# Patient Record
Sex: Female | Born: 1935 | Race: White | Hispanic: No | Marital: Married | Smoking: Former smoker
Health system: Southern US, Community
[De-identification: ages and names within clinical notes are randomized; demographics above are authoritative.]

## PROBLEM LIST (undated history)

## (undated) DIAGNOSIS — I219 Acute myocardial infarction, unspecified: Secondary | ICD-10-CM

## (undated) DIAGNOSIS — E871 Hypo-osmolality and hyponatremia: Secondary | ICD-10-CM

## (undated) DIAGNOSIS — D72829 Elevated white blood cell count, unspecified: Secondary | ICD-10-CM

## (undated) DIAGNOSIS — I350 Nonrheumatic aortic (valve) stenosis: Secondary | ICD-10-CM

## (undated) DIAGNOSIS — I519 Heart disease, unspecified: Secondary | ICD-10-CM

## (undated) DIAGNOSIS — I493 Ventricular premature depolarization: Secondary | ICD-10-CM

## (undated) DIAGNOSIS — Z90710 Acquired absence of both cervix and uterus: Secondary | ICD-10-CM

## (undated) DIAGNOSIS — D62 Acute posthemorrhagic anemia: Secondary | ICD-10-CM

## (undated) DIAGNOSIS — M1990 Unspecified osteoarthritis, unspecified site: Secondary | ICD-10-CM

## (undated) DIAGNOSIS — R0600 Dyspnea, unspecified: Secondary | ICD-10-CM

## (undated) DIAGNOSIS — J449 Chronic obstructive pulmonary disease, unspecified: Secondary | ICD-10-CM

## (undated) DIAGNOSIS — E876 Hypokalemia: Secondary | ICD-10-CM

## (undated) DIAGNOSIS — E785 Hyperlipidemia, unspecified: Secondary | ICD-10-CM

## (undated) DIAGNOSIS — J9620 Acute and chronic respiratory failure, unspecified whether with hypoxia or hypercapnia: Secondary | ICD-10-CM

## (undated) DIAGNOSIS — I259 Chronic ischemic heart disease, unspecified: Secondary | ICD-10-CM

## (undated) DIAGNOSIS — I10 Essential (primary) hypertension: Secondary | ICD-10-CM

## (undated) HISTORY — DX: Chronic ischemic heart disease, unspecified: I25.9

## (undated) HISTORY — DX: Ventricular premature depolarization: I49.3

## (undated) HISTORY — DX: Dyspnea, unspecified: R06.00

## (undated) HISTORY — DX: Nonrheumatic aortic (valve) stenosis: I35.0

## (undated) HISTORY — DX: Heart disease, unspecified: I51.9

## (undated) HISTORY — DX: Essential (primary) hypertension: I10

## (undated) HISTORY — DX: Elevated white blood cell count, unspecified: D72.829

## (undated) HISTORY — DX: Acute myocardial infarction, unspecified: I21.9

## (undated) HISTORY — DX: Hypokalemia: E87.6

## (undated) HISTORY — DX: Unspecified osteoarthritis, unspecified site: M19.90

## (undated) HISTORY — PX: ABDOMINAL HYSTERECTOMY: SHX81

## (undated) HISTORY — DX: Acquired absence of both cervix and uterus: Z90.710

## (undated) HISTORY — DX: Hypo-osmolality and hyponatremia: E87.1

## (undated) HISTORY — DX: Acute posthemorrhagic anemia: D62

## (undated) HISTORY — DX: Hyperlipidemia, unspecified: E78.5

## (undated) HISTORY — PX: VARICOSE VEIN SURGERY: SHX832

---

## 1980-02-23 HISTORY — PX: BUNIONECTOMY: SHX129

## 1997-05-29 ENCOUNTER — Ambulatory Visit (HOSPITAL_COMMUNITY): Admission: RE | Admit: 1997-05-29 | Discharge: 1997-05-29 | Payer: Self-pay | Admitting: Gastroenterology

## 1997-08-15 ENCOUNTER — Ambulatory Visit (HOSPITAL_COMMUNITY): Admission: RE | Admit: 1997-08-15 | Discharge: 1997-08-15 | Payer: Self-pay | Admitting: Ophthalmology

## 1999-02-23 HISTORY — PX: TOTAL KNEE ARTHROPLASTY: SHX125

## 1999-03-02 ENCOUNTER — Other Ambulatory Visit: Admission: RE | Admit: 1999-03-02 | Discharge: 1999-03-02 | Payer: Self-pay | Admitting: Internal Medicine

## 1999-06-24 ENCOUNTER — Encounter: Payer: Self-pay | Admitting: Internal Medicine

## 1999-06-24 ENCOUNTER — Encounter: Admission: RE | Admit: 1999-06-24 | Discharge: 1999-06-24 | Payer: Self-pay | Admitting: Internal Medicine

## 1999-09-09 ENCOUNTER — Encounter: Payer: Self-pay | Admitting: Orthopedic Surgery

## 1999-09-16 ENCOUNTER — Encounter: Payer: Self-pay | Admitting: Orthopedic Surgery

## 1999-09-16 ENCOUNTER — Inpatient Hospital Stay (HOSPITAL_COMMUNITY): Admission: RE | Admit: 1999-09-16 | Discharge: 1999-09-22 | Payer: Self-pay | Admitting: Orthopedic Surgery

## 1999-09-17 ENCOUNTER — Encounter: Payer: Self-pay | Admitting: Orthopedic Surgery

## 1999-09-22 ENCOUNTER — Inpatient Hospital Stay (HOSPITAL_COMMUNITY)
Admission: RE | Admit: 1999-09-22 | Discharge: 1999-09-25 | Payer: Self-pay | Admitting: Physical Medicine & Rehabilitation

## 1999-09-28 ENCOUNTER — Encounter: Payer: Self-pay | Admitting: Internal Medicine

## 1999-09-28 ENCOUNTER — Ambulatory Visit (HOSPITAL_COMMUNITY): Admission: RE | Admit: 1999-09-28 | Discharge: 1999-09-28 | Payer: Self-pay | Admitting: Internal Medicine

## 2001-04-19 ENCOUNTER — Encounter: Payer: Self-pay | Admitting: Internal Medicine

## 2001-04-19 ENCOUNTER — Encounter: Admission: RE | Admit: 2001-04-19 | Discharge: 2001-04-19 | Payer: Self-pay | Admitting: Internal Medicine

## 2002-04-24 ENCOUNTER — Ambulatory Visit (HOSPITAL_COMMUNITY): Admission: RE | Admit: 2002-04-24 | Discharge: 2002-04-24 | Payer: Self-pay | Admitting: Internal Medicine

## 2002-04-24 ENCOUNTER — Encounter: Payer: Self-pay | Admitting: Internal Medicine

## 2003-07-10 ENCOUNTER — Ambulatory Visit (HOSPITAL_COMMUNITY): Admission: RE | Admit: 2003-07-10 | Discharge: 2003-07-10 | Payer: Self-pay | Admitting: Internal Medicine

## 2004-07-15 ENCOUNTER — Ambulatory Visit (HOSPITAL_COMMUNITY): Admission: RE | Admit: 2004-07-15 | Discharge: 2004-07-15 | Payer: Self-pay | Admitting: Internal Medicine

## 2005-09-01 ENCOUNTER — Ambulatory Visit (HOSPITAL_COMMUNITY): Admission: RE | Admit: 2005-09-01 | Discharge: 2005-09-01 | Payer: Self-pay | Admitting: Internal Medicine

## 2006-03-07 DIAGNOSIS — I219 Acute myocardial infarction, unspecified: Secondary | ICD-10-CM

## 2006-03-07 HISTORY — DX: Acute myocardial infarction, unspecified: I21.9

## 2006-03-17 ENCOUNTER — Encounter (HOSPITAL_COMMUNITY): Admission: RE | Admit: 2006-03-17 | Discharge: 2006-06-15 | Payer: Self-pay | Admitting: Cardiology

## 2006-06-02 ENCOUNTER — Ambulatory Visit: Admission: RE | Admit: 2006-06-02 | Discharge: 2006-06-02 | Payer: Self-pay | Admitting: Cardiology

## 2006-06-16 ENCOUNTER — Encounter (HOSPITAL_COMMUNITY): Admission: RE | Admit: 2006-06-16 | Discharge: 2006-07-04 | Payer: Self-pay | Admitting: Cardiology

## 2006-06-17 ENCOUNTER — Ambulatory Visit: Payer: Self-pay | Admitting: Critical Care Medicine

## 2006-09-09 ENCOUNTER — Ambulatory Visit (HOSPITAL_COMMUNITY): Admission: RE | Admit: 2006-09-09 | Discharge: 2006-09-09 | Payer: Self-pay | Admitting: Internal Medicine

## 2007-09-14 ENCOUNTER — Ambulatory Visit (HOSPITAL_COMMUNITY): Admission: RE | Admit: 2007-09-14 | Discharge: 2007-09-14 | Payer: Self-pay | Admitting: Internal Medicine

## 2008-09-16 ENCOUNTER — Ambulatory Visit (HOSPITAL_COMMUNITY): Admission: RE | Admit: 2008-09-16 | Discharge: 2008-09-16 | Payer: Self-pay | Admitting: Internal Medicine

## 2009-01-02 ENCOUNTER — Encounter: Admission: RE | Admit: 2009-01-02 | Discharge: 2009-01-02 | Payer: Self-pay | Admitting: Orthopedic Surgery

## 2009-09-18 ENCOUNTER — Ambulatory Visit (HOSPITAL_COMMUNITY): Admission: RE | Admit: 2009-09-18 | Discharge: 2009-09-18 | Payer: Self-pay | Admitting: Internal Medicine

## 2010-03-13 ENCOUNTER — Ambulatory Visit: Payer: Self-pay | Admitting: Cardiology

## 2010-03-16 ENCOUNTER — Ambulatory Visit: Payer: Self-pay | Admitting: Cardiology

## 2010-03-23 ENCOUNTER — Ambulatory Visit: Admission: RE | Admit: 2010-03-23 | Discharge: 2010-03-23 | Payer: Self-pay | Source: Home / Self Care

## 2010-03-23 ENCOUNTER — Encounter: Payer: Self-pay | Admitting: Cardiology

## 2010-03-23 ENCOUNTER — Ambulatory Visit (HOSPITAL_COMMUNITY)
Admission: RE | Admit: 2010-03-23 | Discharge: 2010-03-23 | Payer: Self-pay | Source: Home / Self Care | Attending: Cardiology | Admitting: Cardiology

## 2010-06-08 ENCOUNTER — Other Ambulatory Visit: Payer: Self-pay | Admitting: Orthopedic Surgery

## 2010-06-08 ENCOUNTER — Other Ambulatory Visit (HOSPITAL_COMMUNITY): Payer: Self-pay | Admitting: Orthopedic Surgery

## 2010-06-08 ENCOUNTER — Ambulatory Visit (HOSPITAL_COMMUNITY)
Admission: RE | Admit: 2010-06-08 | Discharge: 2010-06-08 | Disposition: A | Discharge status: Home / Self Care | Payer: Medicare Other | Source: Ambulatory Visit | Attending: Orthopedic Surgery | Admitting: Orthopedic Surgery

## 2010-06-08 ENCOUNTER — Encounter (HOSPITAL_COMMUNITY): Payer: Medicare Other

## 2010-06-08 DIAGNOSIS — M1710 Unilateral primary osteoarthritis, unspecified knee: Secondary | ICD-10-CM | POA: Insufficient documentation

## 2010-06-08 DIAGNOSIS — Z79899 Other long term (current) drug therapy: Secondary | ICD-10-CM | POA: Insufficient documentation

## 2010-06-08 DIAGNOSIS — J4489 Other specified chronic obstructive pulmonary disease: Secondary | ICD-10-CM | POA: Insufficient documentation

## 2010-06-08 DIAGNOSIS — Z87891 Personal history of nicotine dependence: Secondary | ICD-10-CM | POA: Insufficient documentation

## 2010-06-08 DIAGNOSIS — I700 Atherosclerosis of aorta: Secondary | ICD-10-CM | POA: Insufficient documentation

## 2010-06-08 DIAGNOSIS — I10 Essential (primary) hypertension: Secondary | ICD-10-CM | POA: Insufficient documentation

## 2010-06-08 DIAGNOSIS — J449 Chronic obstructive pulmonary disease, unspecified: Secondary | ICD-10-CM | POA: Insufficient documentation

## 2010-06-08 DIAGNOSIS — Z01812 Encounter for preprocedural laboratory examination: Secondary | ICD-10-CM | POA: Insufficient documentation

## 2010-06-08 DIAGNOSIS — Z01818 Encounter for other preprocedural examination: Secondary | ICD-10-CM

## 2010-06-08 DIAGNOSIS — Z01811 Encounter for preprocedural respiratory examination: Secondary | ICD-10-CM | POA: Insufficient documentation

## 2010-06-08 DIAGNOSIS — Z01810 Encounter for preprocedural cardiovascular examination: Secondary | ICD-10-CM | POA: Insufficient documentation

## 2010-06-08 LAB — COMPREHENSIVE METABOLIC PANEL
Albumin: 4 g/dL (ref 3.5–5.2)
Alkaline Phosphatase: 62 U/L (ref 39–117)
Creatinine, Ser: 0.86 mg/dL (ref 0.4–1.2)
Glucose, Bld: 90 mg/dL (ref 70–99)
Potassium: 3.8 mEq/L (ref 3.5–5.1)
Sodium: 139 mEq/L (ref 135–145)
Total Bilirubin: 0.8 mg/dL (ref 0.3–1.2)
Total Protein: 7.1 g/dL (ref 6.0–8.3)

## 2010-06-08 LAB — URINALYSIS, ROUTINE W REFLEX MICROSCOPIC
Glucose, UA: NEGATIVE mg/dL
Ketones, ur: NEGATIVE mg/dL
Nitrite: NEGATIVE
Protein, ur: NEGATIVE mg/dL
Specific Gravity, Urine: 1.014 (ref 1.005–1.030)
Urobilinogen, UA: 0.2 mg/dL (ref 0.0–1.0)

## 2010-06-08 LAB — CBC
Hemoglobin: 14.8 g/dL (ref 12.0–15.0)
MCHC: 35.2 g/dL (ref 30.0–36.0)
MCV: 95.5 fL (ref 78.0–100.0)
Platelets: 311 10*3/uL (ref 150–400)
RDW: 12.9 % (ref 11.5–15.5)
WBC: 9 10*3/uL (ref 4.0–10.5)

## 2010-06-08 LAB — PROTIME-INR
INR: 0.97 (ref 0.00–1.49)
Prothrombin Time: 13.1 seconds (ref 11.6–15.2)

## 2010-06-08 LAB — DIFFERENTIAL
Basophils Absolute: 0.1 10*3/uL (ref 0.0–0.1)
Neutro Abs: 5.4 10*3/uL (ref 1.7–7.7)
Neutrophils Relative %: 60 % (ref 43–77)

## 2010-06-08 LAB — TYPE AND SCREEN
ABO/RH(D): O POS
Antibody Screen: NEGATIVE

## 2010-06-08 LAB — ABO/RH: ABO/RH(D): O POS

## 2010-06-08 LAB — SURGICAL PCR SCREEN: MRSA, PCR: NEGATIVE

## 2010-06-10 ENCOUNTER — Inpatient Hospital Stay (HOSPITAL_COMMUNITY): Payer: Medicare Other

## 2010-06-10 ENCOUNTER — Inpatient Hospital Stay (HOSPITAL_COMMUNITY)
Admission: RE | Admit: 2010-06-10 | Discharge: 2010-06-18 | DRG: 469 | Disposition: A | Discharge status: Skilled Nursing Facility | Payer: Medicare Other | Source: Ambulatory Visit | Attending: Orthopedic Surgery | Admitting: Orthopedic Surgery

## 2010-06-10 ENCOUNTER — Encounter (HOSPITAL_COMMUNITY): Payer: Self-pay | Admitting: Radiology

## 2010-06-10 DIAGNOSIS — E049 Nontoxic goiter, unspecified: Secondary | ICD-10-CM | POA: Diagnosis present

## 2010-06-10 DIAGNOSIS — Z7982 Long term (current) use of aspirin: Secondary | ICD-10-CM

## 2010-06-10 DIAGNOSIS — E871 Hypo-osmolality and hyponatremia: Secondary | ICD-10-CM | POA: Diagnosis not present

## 2010-06-10 DIAGNOSIS — I10 Essential (primary) hypertension: Secondary | ICD-10-CM | POA: Diagnosis present

## 2010-06-10 DIAGNOSIS — I509 Heart failure, unspecified: Secondary | ICD-10-CM | POA: Diagnosis present

## 2010-06-10 DIAGNOSIS — Z96659 Presence of unspecified artificial knee joint: Secondary | ICD-10-CM

## 2010-06-10 DIAGNOSIS — Z79899 Other long term (current) drug therapy: Secondary | ICD-10-CM

## 2010-06-10 DIAGNOSIS — Z01812 Encounter for preprocedural laboratory examination: Secondary | ICD-10-CM

## 2010-06-10 DIAGNOSIS — J441 Chronic obstructive pulmonary disease with (acute) exacerbation: Secondary | ICD-10-CM | POA: Diagnosis not present

## 2010-06-10 DIAGNOSIS — I5030 Unspecified diastolic (congestive) heart failure: Secondary | ICD-10-CM | POA: Diagnosis present

## 2010-06-10 DIAGNOSIS — Z780 Asymptomatic menopausal state: Secondary | ICD-10-CM

## 2010-06-10 DIAGNOSIS — J438 Other emphysema: Secondary | ICD-10-CM

## 2010-06-10 DIAGNOSIS — Z9861 Coronary angioplasty status: Secondary | ICD-10-CM

## 2010-06-10 DIAGNOSIS — M1710 Unilateral primary osteoarthritis, unspecified knee: Principal | ICD-10-CM | POA: Diagnosis present

## 2010-06-10 DIAGNOSIS — R5381 Other malaise: Secondary | ICD-10-CM | POA: Diagnosis not present

## 2010-06-10 DIAGNOSIS — I252 Old myocardial infarction: Secondary | ICD-10-CM

## 2010-06-10 DIAGNOSIS — J9620 Acute and chronic respiratory failure, unspecified whether with hypoxia or hypercapnia: Secondary | ICD-10-CM | POA: Diagnosis not present

## 2010-06-10 DIAGNOSIS — E876 Hypokalemia: Secondary | ICD-10-CM | POA: Diagnosis not present

## 2010-06-10 DIAGNOSIS — I2510 Atherosclerotic heart disease of native coronary artery without angina pectoris: Secondary | ICD-10-CM | POA: Diagnosis present

## 2010-06-10 DIAGNOSIS — J9600 Acute respiratory failure, unspecified whether with hypoxia or hypercapnia: Secondary | ICD-10-CM

## 2010-06-10 DIAGNOSIS — J9819 Other pulmonary collapse: Secondary | ICD-10-CM

## 2010-06-10 HISTORY — DX: Chronic obstructive pulmonary disease, unspecified: J44.9

## 2010-06-10 LAB — BLOOD GAS, ARTERIAL
Acid-Base Excess: 0.7 mmol/L (ref 0.0–2.0)
Bicarbonate: 24.4 mEq/L — ABNORMAL HIGH (ref 20.0–24.0)
FIO2: 0.4 %
O2 Saturation: 87.3 %
Patient temperature: 98.6
TCO2: 21.7 mmol/L (ref 0–100)
pH, Arterial: 7.425 — ABNORMAL HIGH (ref 7.350–7.400)
pO2, Arterial: 54.1 mmHg — ABNORMAL LOW (ref 80.0–100.0)

## 2010-06-10 MED ORDER — IOHEXOL 300 MG/ML  SOLN
80.0000 mL | Freq: Once | INTRAMUSCULAR | Status: AC | PRN
  Administered 2010-06-10: 80 mL via INTRAVENOUS

## 2010-06-11 LAB — PROTIME-INR
INR: 1.18 (ref 0.00–1.49)
Prothrombin Time: 15.2 seconds (ref 11.6–15.2)

## 2010-06-11 NOTE — Op Note (Signed)
NAME:  Heidi Morales, Heidi Morales NO.:  [id number]  MEDICAL RECORD NO.:  [id number]           PATIENT TYPE:  I  LOCATION:  0004                         FACILITY:  [facility]  PHYSICIAN:  Georges Lynch. Jaythen Hamme, M.D.DATE OF BIRTH:  [birth year]  DATE OF PROCEDURE:  06/10/2010 DATE OF DISCHARGE:                              OPERATIVE REPORT   SURGEON:  Georges Lynch. Darrelyn Hillock, M.D.  ASSISTANT:  Rozell Searing, P.A.C.  PREOPERATIVE DIAGNOSIS:  Severe degenerative arthritis, left knee.  POSTOPERATIVE DIAGNOSIS:  Severe degenerative arthritis, left knee.  OPERATION:  Left total knee arthroplasty utilizing the DePuy system.  I used gentamicin in the cement.  The sizes of the prosthesis was patella was 35 mm 3 pegs, femoral component was a size 3 left posterior stabilized, the tibial tray was a size 3 cemented, the insert was a size 3, 10-mm thickness.  DESCRIPTION OF PROCEDURE:  Under spinal anesthesia, routine orthopedic prep and draping of the left lower extremity was carried out.  At this time, an appropriate time-out was carried out in the operating room prior to incision, and prior to this, I marked the appropriate left leg in the holding area.  Once we did a sterile prep and draping, I then applied the _____ Mayo leg holder.  I then esmarched the leg and set the tourniquet up to 325 mmHg.  The knee was flexed, and incision was made over the anterior aspect of the left knee.  Bleeders identified and cauterized.  At this time, 2 flaps were created.  I carried out a median parapatellar approach, reflected the patella laterally, and then did a synovectomy.  I also did a medial and lateral meniscectomy and excised the anterior posterior cruciate ligaments.  Following that, initial drill hole is made in the intercondylar notch.  The guide rod then was inserted up into the femur, and I removed 12 mm thickness off the distal femur with a 5-degree valgus angle.  Following that, I then measured  the femur.  The femur initially was measured be a size 3.  We then cut our anterior-posterior chamfering cuts in usual fashion.  Once the femur was prepared, I then went down and prepared the tibia.  We removed approximately initially 6 mm thickness off the affected medial side.  We had to go another additional 2 because of the contracture of the knee. Following that, we then cut our keel cut out of the tibia, and prior to doing that, I went through the tension measurements but with the tension guides.  We had good flexion and good extension.  I then made sure there were no spurs posteriorly in the femoral condyle as well.  The keel cut was made.  I then did our femoral notch cut.  I then inserted our trial components after we thoroughly irrigated out the knee, and we had excellent fit with the 10-mm thickness insert.  Following that, I then did a resurfacing procedure on the patella for a size 35 patella.  Three drill holes were made in the patella.  At this time, all trial components were removed.  I thoroughly water picked  out the knee, cleaned the knee out cemented all 3 components in simultaneously. Following that, I made sure there were no loose pieces of cement present.  I then irrigated the knee out again, and I injected 10 mL of FloSeal into the posterior and medial and lateral aspects of the soft tissue of the knee and then inserted my permanent rotating platform size 3, 10 mm thickness tray.  I then reduced the knee after we made sure there were no other loose pieces of cement.  Indeed, we had excellent flexion, excellent extension. Hemovac drain was inserted.  The knee was closed in layers in usual fashion.  Note all 3 components were cemented.  was used in the cement. Skin was closed with metal staples.  Sterile Neosporin dressings were applied.  She had 1 g of IV Ancef preop.          ______________________________ Georges Lynch. Darrelyn Hillock, M.D.     RAG/MEDQ  D:   06/10/2010  T:  06/10/2010  Job:  [id number]  cc:   Geoffry Paradise, M.D. Fax: [phone]  Electronically Signed by Ranee Gosselin M.D. on 06/11/2010 08:06:34 AM

## 2010-06-11 NOTE — H&P (Signed)
NAME:  Heidi Morales, Heidi Morales NO.:  [id number]  MEDICAL RECORD NO.:  [id number]           PATIENT TYPE:  I  LOCATION:  1226                         FACILITY:  [facility]  PHYSICIAN:  Rozell Searing, [facility]    DATE OF BIRTH:  08/10/1930  DATE OF ADMISSION:  06/10/2010 DATE OF DISCHARGE:                             HISTORY & PHYSICAL   PHYSICIAN:  Windy Fast A. Gioffre, M.D.  CHIEF COMPLAINT:  Left knee pain.  BRIEF HISTORY:  [name] has been followed by Dr. Darrelyn Hillock for worsening pain in her left knee.  She had a right total knee about 11 years ago performed by Dr. Darrelyn Hillock and she has done well with that.  At this point, she feels the left knee is starting to feel the same as the right did prior to surgery, it is giving her pain at all times and feels as though it will give out on her.  It is affecting the activities she is able to do.  She now presents for a left total knee arthroplasty.  MEDICATION ALLERGIES:  She has sensitivity to [facility], this causes a drop in her blood pressure.  PRIMARY CARE PHYSICIAN:  Geoffry Paradise, M.D.  CARDIOLOGIST:  Colleen Can. Deborah Chalk, M.D.  CURRENT MEDICATIONS: 1. Norvasc. 2. Diovan. 3. Hydrochlorothiazide. 4. Zocor. 5. Bystolic. 6. Aspirin. 7. ICaps. 8. Citracal. 9. Vitamin D3. 10.Alprazolam p.r.n. insomnia.  PAST MEDICAL HISTORY: 1. End-stage arthritis of the left knee. 2. COPD, emphysema. 3. Hypertension. 4. Heart disease. 5. Arthritis. 6. History of menopause.  PAST SURGICAL HISTORY: 1. Vein stripping in 1965. 2. Hysterectomy in 1974. 3. Bunionectomy in 1976. 4. Right total knee arthroplasty in 2001.  FAMILY HISTORY:  Father passed at the age of 32, he had heart disease. Mother passed at the age of 44 also of heart disease.  SOCIAL HISTORY:  The patient is married.  She is retired.  She admits past use of tobacco products.  She has about 1 ounce of alcohol daily. She has 3 children.  She lives at home with her  family.  She does plan to go home following her hospital stay.  We will arrange for Genevieve Norlander to come out and do home therapy.  REVIEW OF SYSTEMS:  GENERAL:  Negative for fevers, chills, or weight change.  HEENT/NEURO:  Negative for headache, blurred vision, or insomnia.  DERMATOLOGIC:  Negative for rash or lesion.  RESPIRATORY: Positive for shortness of breath on exertion.  CARDIOVASCULAR:  Negative for chest pain or palpitations.  Last EKG was June 08, 2010.  GI: Negative for nausea, vomiting, or diarrhea.  GU:  Negative for hematuria or dysuria.  MUSCULOSKELETAL:  Positive for joint pain and joint swelling.  Ms. Grauberger has been cleared for surgery by both Dr. Ethelene Browns and Dr. Deborah Chalk.  PHYSICAL EXAMINATION:  VITAL SIGNS:  Pulse 72, respirations 18, blood pressure 138/84 in the left arm. GENERAL:  [name] is alert and oriented x3.  She is a pleasant 77- year-old female.  She has a stated height of 5 feet 4 inches and a stated weight of 150 pounds.  She is in no  apparent distress.  She is accompanied today by her husband. NECK:  Supple, full range of motion without lymphadenopathy. CHEST:  Lungs are clear to auscultation bilaterally without wheezes, rhonchi, or rales. HEART:  Regular rate and rhythm without murmur. ABDOMEN:  Bowel sounds present in all 4 quadrants. Abdomen is soft, nontender to palpation. EXTREMITIES:  Left knee:  Moderate effusion, pain with range of motion. No instability is noted.  She has clunking with range of motion. SKIN:  Unremarkable. NEUROLOGIC:  Intact. PERIPHERAL VASCULAR:  Carotid pulses 2+ bilaterally without bruit.  RADIOGRAPHS:  AP and lateral views of the left knee reveal end-stage arthritis, tricompartmental.  IMPRESSION:  End-stage arthritis of the left knee.  PLAN:  Left total knee arthroplasty to be performed by Dr. Darrelyn Hillock.     Rozell Searing, [facility]     LD/MEDQ  D:  06/11/2010  T:  06/11/2010  Job:  [id number]  Electronically Signed  by Rozell Searing  on 06/11/2010 09:32:54 AM

## 2010-06-12 ENCOUNTER — Inpatient Hospital Stay (HOSPITAL_COMMUNITY): Payer: Medicare Other

## 2010-06-12 LAB — HEMOGLOBIN AND HEMATOCRIT, BLOOD: HCT: 29.7 % — ABNORMAL LOW (ref 36.0–46.0)

## 2010-06-13 ENCOUNTER — Inpatient Hospital Stay (HOSPITAL_COMMUNITY): Payer: Medicare Other

## 2010-06-13 LAB — EXPECTORATED SPUTUM ASSESSMENT W GRAM STAIN, RFLX TO RESP C

## 2010-06-13 LAB — DIFFERENTIAL
Basophils Relative: 0 % (ref 0–1)
Eosinophils Absolute: 0.2 10*3/uL (ref 0.0–0.7)
Lymphs Abs: 1.5 10*3/uL (ref 0.7–4.0)
Neutrophils Relative %: 80 % — ABNORMAL HIGH (ref 43–77)

## 2010-06-13 LAB — BASIC METABOLIC PANEL
Chloride: 91 mEq/L — ABNORMAL LOW (ref 96–112)
GFR calc Af Amer: 58 mL/min — ABNORMAL LOW (ref >60)
Potassium: 3.1 mEq/L — ABNORMAL LOW (ref 3.5–5.1)

## 2010-06-13 LAB — CBC
Platelets: 229 10*3/uL (ref 150–400)
RBC: 3.08 MIL/uL — ABNORMAL LOW (ref 3.87–5.11)
WBC: 13.8 10*3/uL — ABNORMAL HIGH (ref 4.0–10.5)

## 2010-06-13 LAB — HEMOGLOBIN AND HEMATOCRIT, BLOOD
HCT: 27.1 % — ABNORMAL LOW (ref 36.0–46.0)
Hemoglobin: 9.5 g/dL — ABNORMAL LOW (ref 12.0–15.0)

## 2010-06-13 LAB — PROTIME-INR: INR: 1.83 — ABNORMAL HIGH (ref 0.00–1.49)

## 2010-06-14 DIAGNOSIS — I5030 Unspecified diastolic (congestive) heart failure: Secondary | ICD-10-CM

## 2010-06-14 DIAGNOSIS — J9600 Acute respiratory failure, unspecified whether with hypoxia or hypercapnia: Secondary | ICD-10-CM

## 2010-06-14 DIAGNOSIS — J9819 Other pulmonary collapse: Secondary | ICD-10-CM

## 2010-06-14 DIAGNOSIS — J449 Chronic obstructive pulmonary disease, unspecified: Secondary | ICD-10-CM

## 2010-06-14 LAB — PROTIME-INR
INR: 2.4 — ABNORMAL HIGH (ref 0.00–1.49)
Prothrombin Time: 26.3 seconds — ABNORMAL HIGH (ref 11.6–15.2)

## 2010-06-15 ENCOUNTER — Inpatient Hospital Stay (HOSPITAL_COMMUNITY): Payer: Medicare Other

## 2010-06-15 DIAGNOSIS — J449 Chronic obstructive pulmonary disease, unspecified: Secondary | ICD-10-CM

## 2010-06-15 DIAGNOSIS — I059 Rheumatic mitral valve disease, unspecified: Secondary | ICD-10-CM

## 2010-06-15 DIAGNOSIS — J9600 Acute respiratory failure, unspecified whether with hypoxia or hypercapnia: Secondary | ICD-10-CM

## 2010-06-15 DIAGNOSIS — M1710 Unilateral primary osteoarthritis, unspecified knee: Secondary | ICD-10-CM

## 2010-06-15 DIAGNOSIS — Z96659 Presence of unspecified artificial knee joint: Secondary | ICD-10-CM

## 2010-06-15 LAB — BASIC METABOLIC PANEL
CO2: 24 mEq/L (ref 19–32)
Calcium: 9 mg/dL (ref 8.4–10.5)
Creatinine, Ser: 0.98 mg/dL (ref 0.4–1.2)
GFR calc Af Amer: >60 mL/min (ref >60)

## 2010-06-15 LAB — CBC
MCH: 34.3 pg — ABNORMAL HIGH (ref 26.0–34.0)
Platelets: 307 10*3/uL (ref 150–400)
RBC: 3 MIL/uL — ABNORMAL LOW (ref 3.87–5.11)
RDW: 13.1 % (ref 11.5–15.5)

## 2010-06-15 LAB — PROTIME-INR: INR: 2.53 — ABNORMAL HIGH (ref 0.00–1.49)

## 2010-06-15 LAB — URINALYSIS, ROUTINE W REFLEX MICROSCOPIC
Hgb urine dipstick: NEGATIVE
pH: 5.5 (ref 5.0–8.0)

## 2010-06-15 LAB — CULTURE, RESPIRATORY W GRAM STAIN

## 2010-06-16 LAB — CBC
HCT: 30.3 % — ABNORMAL LOW (ref 36.0–46.0)
MCH: 33 pg (ref 26.0–34.0)
MCV: 96.2 fL (ref 78.0–100.0)
RBC: 3.15 MIL/uL — ABNORMAL LOW (ref 3.87–5.11)
WBC: 12.8 10*3/uL — ABNORMAL HIGH (ref 4.0–10.5)

## 2010-06-16 LAB — BASIC METABOLIC PANEL
Chloride: 97 mEq/L (ref 96–112)
GFR calc Af Amer: >60 mL/min (ref >60)
Potassium: 3.9 mEq/L (ref 3.5–5.1)
Sodium: 133 mEq/L — ABNORMAL LOW (ref 135–145)

## 2010-06-16 LAB — BRAIN NATRIURETIC PEPTIDE: Pro B Natriuretic peptide (BNP): 34.8 pg/mL (ref 0.0–100.0)

## 2010-06-17 DIAGNOSIS — R0902 Hypoxemia: Secondary | ICD-10-CM

## 2010-06-17 LAB — URINE CULTURE
Colony Count: 60000
Culture  Setup Time: 201204231442
Special Requests: NEGATIVE

## 2010-06-17 LAB — PROTIME-INR: INR: 2.41 — ABNORMAL HIGH (ref 0.00–1.49)

## 2010-06-18 LAB — CBC
HCT: 29.5 % — ABNORMAL LOW (ref 36.0–46.0)
Hemoglobin: 9.8 g/dL — ABNORMAL LOW (ref 12.0–15.0)
MCH: 32.1 pg (ref 26.0–34.0)
MCHC: 33.2 g/dL (ref 30.0–36.0)
MCV: 96.7 fL (ref 78.0–100.0)
Platelets: 439 10*3/uL — ABNORMAL HIGH (ref 150–400)
RBC: 3.05 MIL/uL — ABNORMAL LOW (ref 3.87–5.11)
RDW: 13.1 % (ref 11.5–15.5)
WBC: 14.4 10*3/uL — ABNORMAL HIGH (ref 4.0–10.5)

## 2010-06-19 NOTE — Discharge Summary (Signed)
NAME:  Heidi Morales, MARKERT NO.:  [id number]  MEDICAL RECORD NO.:  [id number]           PATIENT TYPE:  I  LOCATION:  1501                         [facility]ACILITY:  [facility]  PHYSICIAN:  Rozell Searing, [facility]    DATE OF BIRTH:  1930/03/27  DATE OF ADMISSION:  06/10/2010 DATE OF DISCHARGE:                              DISCHARGE SUMMARY   ADMITTING DIAGNOSES: 1. End-stage arthritis of the left knee. 2. Chronic obstructive pulmonary disease/emphysema. 3. Hypertension. 4. Heart disease. 5. Arthritis. 6. History of menopause.  DISCHARGE DIAGNOSES: 1. End-stage arthritis of the left knee, status post left total knee     arthroplasty. 2. History of chronic obstructive pulmonary disease with exacerbation     while admitted to the hospital. 3. Hypertension. 4. Heart disease. 5. Arthritis. 6. History of menopause. 7. Hyponatremia and hypokalemia, postoperative.  PROCEDURE:  On 06/10/10, [name] was taken to the operating room by surgeon, Dr. Ranee Gosselin, assistant, Rozell Searing PA-C.  She underwent left total knee arthroplasty.  The procedure was performed under spinal anesthesia.  Routine orthopedic prep and drape were carried out.  The patient received 1 g of IV Ancef preoperatively.  There were no complications with the procedure.  She was returned to the recovery room in satisfactory condition.  Postoperative radiographs revealed satisfactory left knee replacement.  LABORATORY DATA:  Preoperative labs drawn on April 16 revealed white count of 9.0, hemoglobin 14.8, hematocrit 42 and platelet count of 311. Preoperative INR was 0.97.  The patient was not on anticoagulant preoperatively.  Preoperative chemistry panel was unremarkable. Preoperative urinalysis revealed cloudy urine but was negative for infection.  Preoperative PCR for MRSA and staph were both negative. Preoperative chest x-ray revealed chronic interstitial pulmonary prominence but no active disease.  On  operative day following the surgery, the patient was having difficulty with her oxygen saturations, she was at 92% on 5 L.  Blood gases drawn revealed elevated pH and bicarb, and a chest x-ray was also done that day, which revealed COPD and bronchitis but no acute findings. On postoperative day #1, the patient's hemoglobin had dropped to 11.7, hematocrit at 34, her INR had reached 1.18.  Postoperative day #2 hemoglobin dropped further to 10.5, INR had reached 1.59.  Postop day #3 hemoglobin reached a low of 9.5, she did not require blood transfusion throughout her hospital stay, and INR had reached 1.83.  A CBC was also drawn on postoperative day #3 that revealed elevated white blood cells at 13.8, and chemistry panel that revealed hyponatremia as well as hypokalemia at 130 and 3.1 respectively.  Postop day #4, the patient's hemoglobin had reached 9.8, her INR was therapeutic at 2.4.  Postop day #5, hemoglobin was up to 10.3, CBC revealed a white count as high as 19.1, INR remained therapeutic at 2.53.  Chemistry panel revealed that the hypokalemia had resolved; she was still experiencing hyponatremia at 130.  Postop day #6, the white count was beginning to normalize at 12.8, hemoglobin was holding at 10.4, her INR had become subtherapeutic at 1.83 and postop day #7, the patient's INR was again therapeutic at 2.41.  HOSPITAL COURSE:  [name] was admitted to [facility] on 06/10/10.  She underwent the above-stated procedure without complication.  However, following the procedure, she started having trouble in the PACU with oxygenation.  There was concern that she had possibly thrown a blood clot; Dr. Darrelyn Hillock was called regarding this.  He said more likely that she could have thrown a fat emboli.  He started her on Decadron to help resolve this.  She also had a CT angio of the chest that was negative.  Then she was taken to the ICU for further care.  In regard to the knee pain,  she was placed on PCA, reduced dose of Dilaudid as well as muscle relaxant.  Pulmonary consult was called, so Dr. Sherene Sires came to see the patient.  He started her on breathing treatments and she was placed on a 50% non-rebreather mask.  The patient was started on Coumadin and heparin therapy per pharmacy postop to help protect her against blood clots.  Postop day #2, the patient was able to ambulate very, very short distances with therapy.  However, she remained very short of breath, her pulse ox was monitored at all times.  She remained on 50% Ventimask and her oxygen saturations remained in the low 90s throughout postop day #1, 2, 3 and 4.  On postoperative day #4, the patient was found to be well enough that she was transferred from the ICU to the telemetry unit.  She was continued on O2 of at least 3 L to keep her oxygen saturations above 86% per cent per Pulmonary.  Chest x- ray was repeated on postoperative day #5 and this revealed COPD with no acute process.  The patient was visited by Physical Therapy.  On postop day #5, she was able to ambulate 10 feet twice and then 15 feet twice. This was all while keeping the oxygen on and monitoring her pulse ox, which remained about 88% throughout the ambulation.  She was able to also work on transferring.  Postoperative day #6, the patient continued to progress, she had physical therapy.  She was able to work further on ambulation and transferring, unfortunately unable to walk very far distances mainly because of her deconditioning and poor oxygen saturation.  Postoperative day #7, the patient continues to progress. It was ordered for continuous pulse ox to be monitored, she does continue to improve.  She had another physical therapy session.  On postoperative day #7, she was able to ambulate 45 feet twice using a rolling walker without assistance.  She had a second physical therapy session that day in which she was able to ambulate 25 feet  twice.  She did have to wear the oxygen, on 2 L nasal cannula throughout the therapy session.  On Postoperative day #8, after discussion with Dr. Jacky Kindle, Dr. Darrelyn Hillock decided that the patient would do best if she was able to go to a skilled nursing facility for further rehab.  She chooses to go to [facility].  In her therapy session in the hospital that day she was able to ambulate 40 feet in the hallway with the rolling walker and minimal assistance.  She works again on transferring.  Disposition to skilled nursing facility on postoperative day #8.  MEDICATIONS ON DISCHARGE: 1. Acetaminophen 325 mg 1 to 2 tablets p.o. q.6 h p.r.n. pain. 2. Alprazolam 0.25 mg 1 tablet p.o. b.i.d. p.r.n. anxiety. 3. Dulcolax 5 mg tablet 2 tablets p.o. daily p.r.n. constipation. 4. Benadryl 25 mg 1 to  2 tablets p.o. q.6 h p.r.n. itching. 5. Docusate sodium 100 mg capsule 1 tablet p.o. b.i.d. p.r.n.     constipation. 6. Iron, ferrous sulfate 325 mg 1 tablet p.o. t.i.d. p.r.n. anemia. 7. Robaxin 500 mg 1 tablet p.o. q.6 h p.r.n. muscle spasm. 8. Oxycodone/acetaminophen 5/325 mg 1 to 2 tablets p.o. q.4 to 6 h     p.r.n. pain. 9. MiraLAX 17 g by mouth daily. 10.Spiriva Aerolizer 18 mcg inhaled daily. 11.Coumadin; this should be dosed per skilled nursing facility     pharmacy.  Her INR needs to be kept between 2 and 3 to remain     therapeutic.  She will remain on Coumadin therapy for 4 weeks from     the day of surgery. 12.Bystolic 10 mg 1 tablet p.o. every morning. 13.Citracal 1 tablet p.o. every morning. 14.Diovan 320 mg 1 tablet p.o. every morning. 15.Hydrochlorothiazide 25 mg, half tablet p.o. every morning. 16.Norvasc 5 mg 1 tablet p.o. every morning. 17.Simvastatin 40 mg 1 tablet p.o. at bedtime. 18.Vitamin vision, ICaps once daily. 19.Vitamin D3 1000 units 1 tablet p.o. every morning. 20.Aspirin 325 mg.  She will discontinue this while she is on the     Coumadin therapy.  ACTIVITY:  The  patient should increase her activity slowly.  She is full weightbearing.  She should be working on range of motion with physical therapy as well as gait training, really focusing on endurance, but she should have a pulse oximeter on at all times while doing therapy. The patient is able to shower and she should be using a walker for ambulation.  DIET:  No restrictions.  WOUND CARE:  Daily dressing change.  FOLLOWUP:  She should follow up with Dr. Darrelyn Hillock in the office 2 weeks from the day of surgery.  Please contact the office at [phone] to schedule this appointment.  Skilled nursing facility can schedule this appointment for the patient.  CONDITION ON DISCHARGE:  Slowly improving.  Dictated For:  Dr. Ranee Gosselin.     Rozell Searing, [facility]     LD/MEDQ  D:  06/18/2010  T:  06/18/2010  Job:  [id number]  Electronically Signed by Rozell Searing  on 06/19/2010 03:14:34 PM

## 2010-07-07 ENCOUNTER — Inpatient Hospital Stay: Payer: Medicare Other | Admitting: Pulmonary Disease

## 2010-07-10 NOTE — Discharge Summary (Signed)
[facility]  Patient:    Heidi Morales, Heidi Morales                        MRN: [id number] Adm. Date:  [id number] Disc. Date: 09/22/99 Attending:  Skip Mayer Dictator:   Alexzandrew L. Julien Girt, P.A.-C. CC:         Georges Lynch. Darrelyn Hillock, M.D.             Faith Rogue, M.D.             Richard A. Jacky Kindle, M.D.                           Discharge Summary  Patient was originally scheduled to be transferred to [facility] yesterday; however, she had complaints of lightheadedness.  Therefore, a followup hemoglobin and hematocrit were ordered prior to her discharge.  It was noted that her hemoglobin had dropped down to a level of 7.7 with a hematocrit of 22.2; this was down from the levels two days prior of 8.7 and 24.6.  She did have two units of blood and it was felt she would benefit from undergoing transfusion; therefore, her discharge to [facility] was held.  She underwent transfusion.  Post-transfusion hemoglobin was back up to 10.8 and hematocrit 30.8.  She tolerated the transfusion very well.  She was much improved.  She was still progressing with physical therapy.  She did have somewhat of a slow progress due to the nature of the surgery and also her postoperative anemia.  It was noted on the following day that she was much improved following the transfusion.  She was still progressing with physical therapy.  A bed was still available in the [facility]; therefore, it was decided the patient would be transferred at that time.  DISCHARGE PLAN:  Please see previously dictated discharge plan.  ADD TO ACTIVITY:  She will need to continue with the CPM for passive range of motion while on the rehab unit, including her total knee protocol. DD:  09/22/99 TD:  09/22/99 Job: [id number]

## 2010-07-10 NOTE — Discharge Summary (Signed)
[facility]  Patient:    Heidi Morales, Heidi Morales                        MRN: [id number] Adm. Date:  [id number] Disc. Date: 09/21/99 Attending:  Skip Mayer Dictator:   Nettie Elm, M.D. CC:         Georges Lynch. Darrelyn Hillock, M.D.                           Discharge Summary  ADMITTING DIAGNOSES: 1. Osteoarthritis of right knee. 2. Hypertension. 3. Macular degeneration.  DISCHARGE DIAGNOSES: 1. Osteoarthritis of right knee, status post right total knee arthroplasty. 2. Posthemorrhagic anemia. 3. Postoperative respiratory depression secondary to narcotics. 4. Hyponatremia. 5. Hypokalemia. 6. 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  PROCEDURE:  The patient was taken to the operating room on September 16, 1999, and underwent a right total knee replacement arthroplasty, surgeon Dr. Worthy Rancher, assistant Dr. Trellis Paganini.  CONSULTATIONS:  Rehabilitation with Dr. Riley Kill.  Medical service with Dr. Jacky Kindle.  HISTORY OF PRESENT ILLNESS:  The patient is a 77 year old female who has had pain in the right knee over the past year.  She has undergone a number of conservative treatments including antiinflammatories, Synvisc injections as well as arthroscopic debridement.  X-rays in the office revealed tricompartment degenerative changes that were also confirmed by arthroscopic evaluation.  The patients pain has continued to increase and has been refractory to conservative measures.  It was felt that she would benefit undergoing total knee replacement arthroplasty.  Risks and benefits of the procedure were discussed with the patient and she elected to proceed with surgical intervention.  LABORATORY DATA AND X-RAY FINDINGS:  Hemoglobin 12.3, hematocrit 34.8, white cell count 13.1, red cell count 3.49.  Serial H&Hs were followed throughout the surgical course.  Postsurgical hemoglobin and hematocrit were 11.5 and 32.4.  Hemoglobin and hematocrit  continued to decline down to a level of 8.6 and 24.4 on September 18, 1999.  Follow up last noted H&H in the chart had come back up a little bit to 8.7 and 27.6.  PT/PTT on admission were 13.1 and 29 respectively.  Serum PTs were followed per Coumadin protocol with last noted PT/INR 17.4 and 1.8.  Chem panel on admission all within normal limits. Followup electrolytes on July 29, showed a drop in potassium to 3.4, drop in chloride to 91 and drop in sodium to 131.  Urinalysis on admission was negative.  Blood type O positive.  Preoperative knee film showed marked degeneration of medial compartment on the right knee.  Preoperative chest x-ray report dated March 02, 1999, with no active disease.  This was compared to previous chest film of December 28, 1996. Follow up knee films showed right total knee prosthesis near anatomic alignment.  Follow up chest x-ray on September 17, 1999, showed prominent markings particularly in the right mid lung field.  These were thought to represent chronic changes with no definite pneumonia.  Preop EKG dated March 02, 1999, showed no impression of this faxed copy.  Follow up EKG on September 17, 1999, showed sinus tachycardiac, otherwise normal EKG.  This was an unconfirmed EKG.  HOSPITAL COURSE:  The patient was admitted to [facility] and taken to the OR.  She underwent the above-mentioned procedure without complication. The patient tolerated the procedure well and was transferred to the recovery room and then to the orthopedic floor.  She  was continued postoperative care. She was noted later that evening to be somewhat sedate and unable to be aroused.  It was felt to be narcotic over sedation.  She was given Narcan. The patient was arousable following the dose of Narcan.  The PCA analgesics were held until she was more alert and then they restarted back at a low dose. She was found to be, the following morning, more alert.  However, she had some audible  wheezing with some tachycardia.  Chest x-ray was ordered and ruled out pneumonia.  It was felt to be atelectasis.  She was also given some albuterol for the respiratory wheezing.  Dr. Jacky Kindle was consulted for medical management.  It was felt possibly to be atelectasis and was treated with inhalants, incentive spirometer and antipruritics.  She was noted to have some vomiting on postop day #1.  Anesthesia was consulted to evaluate her pain management.  She continued along with the PCA pain pump.  Her pain management did improve.  Her physical therapy was held the first day.  It was started on postop day #2 and was initially slow to progress with physical therapy.  Wound care consult was also initiated on postop day #2.  The wound was healing well with some mild postoperative swelling, but the amount was expected.  Nausea did improve.  She was started on Coumadin postoperatively.  Due to her slow progress, rehabilitation consult was called in.  The patient was seen and evaluated by Dr. Riley Kill from rehabilitation services and felt she would be an appropriate candidate.  It was decided the patient would be transferred at which time she was medically stable and a bed was available.  She was also noted to have a drop in sodium and potassium.  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  She was continued to be monitored throughout the weekend.  Her respiratory status improved and her lungs were clear.  The wheezing resolved. On Monday, September 21, 1999, she was doing much better.  She had been seen by Dr. Jacky Kindle who felt she was ready for rehabilitation and from an orthopedic standpoint she was improving.  She was felt to be an appropriate candidate. Therefore, arrangements were made for the patient to be transferred to [facility].  At the time of this dictation, bed availability had not been determined.  Therefore, tentative arrangements for transfer have been made.  If a bed becomes  available, the patient will subsequently be transferred to [facility].  The only complaint the patient had  was lightheadedness and H&H will be rechecked due to the patient having a drop in her hemoglobin postoperatively.  However, it appeared to have stabilized. We will check it prior to her transfer.  DISPOSITION:  The patient will be transferred to [facility] on September 21, 1999, pending bed availability.  DISCHARGE MEDICATIONS:  1. Colace 100 mg p.o. b.i.d.  2. Trinsicon t.i.d.  3. Norvasc 5 mg p.o. q.d.  4. Avapro 300 mg daily.  5. Hydrochlorothiazide 12.5 mg p.o. q.d.  6. Ogen 0.625 mg p.o. q.d.  7. Ventolin inhaler q.4h. p.r.n.  8. K-Dur 20 mEq p.o. q.d.  9. OxyContin 10 mg p.o. q.12h. 10. Coumadin as per pharmacy protocol. 11. Pepcid 20 mg p.o. b.i.d. 12. Reglan 5-10 mg p.o. q.6h. p.r.n. nausea. 13. Xanax 0.2 mg p.o. q.6h. p.r.n. anxiety. 14. Oxycodone 5 mg one every four to six hours as needed for pain.  DIET:  Low sodium diet.  ACTIVITY:  She will continue with total knee protocol.  Continue with gait training ambulation while on [facility].  She is currently weightbearing as tolerated on the right lower extremity.  WOUND CARE:  Continue with wound care and dressing changes daily.  FOLLOWUP:  The patient is to follow up with Dr. Darrelyn Hillock in the office in two weeks from the date of surgery or following the discharge from the [facility].  CONDITION ON DISCHARGE:  Discharge is pending at this time.  The patient is stable.  LABS PENDING:  There is an H&H pending at the time of this dictation. DD:  09/21/99 TD:  09/21/99 Job: [id number]

## 2010-07-10 NOTE — Discharge Summary (Signed)
[place]  Patient:    CARLIE, SOLORZANO                        MRN: [id number] Adm. Date:  [id number] Disc. Date: [id number] Attending:  Faith Rogue T Dictator:   Dian Situ, PA CC:         Georges Lynch. Darrelyn Hillock, M.D.  Richard A. Jacky Kindle, M.D.   Discharge Summary  DISCHARGE DIAGNOSES: 1. Status post right total knee replacement. 2. Hyponatremia, improved. 3. Leukocytosis almost resolved.  HISTORY OF PRESENT ILLNESS:  Ms. Ernie Avena is a 77 year old female with history of hypertension, DJD right knee, who has failed conservative therapy and elected to undergo right total knee replacement on September 16, 1999, by Dr. Darrelyn Hillock.  Postop, has had some problems with hypoxia, nausea, as well as pain control.  She has had some electrolyte abnormalities that are not improving. Has received 2 units of packed red blood cells secondary to a drop in her H&H. Has been doing well with therapies.  Is moderately independent for transfers, moderately independent for ambulating 40 to 80 feet with standard walker with knee flexion at 62 degrees.  PAST MEDICAL HISTORY: 1. Hypertension. 2. Macular degeneration. 3. Hysterectomy. 4. Varicose vein stripping. 5. History of pelvic fractures.  ALLERGIES:  No known drug allergies.  SOCIAL HISTORY:  The patient lives with husband in two level home.  Was independent prior to admission.  She has several steps at entry, and a flight of steps to bedroom.  She smokes 1/2 pack per day, and has one glass of alcoholic beverage every day.  HOSPITAL COURSE:  The patient was admitted to rehab on September 22, 1999, for inpatient therapies to consist of PT and OT daily.  Past admission the patient was started on PT and OT at ______ level.  She was maintained on Coumadin for DVT prophylaxis.  Followup labs were done at admission showing sodium with a slight dip to 130.  She was placed on mild fluid restriction, and  recheck prior to discharge showed some improvement with sodium at 132, potassium 5.1, chloride 95, CO2 28, BUN 14, creatinine 0.8, glucose 96.  H&H has been stable at 11.4 and 31.9.  White count noted to be improving from 12.8 on admission to 10.6 by time of discharge.  UAUCS done on admission was negative with some multi species growing.  The patients right knee incision has healed well without any signs or symptoms of infection, no drainage, no erythema noted. She has had some complaints of right heel numbness, and has been advised regarding symptomatic treatment.  She is to follow up with Dr. Darrelyn Hillock in one week for staple removal, as well as for staple removal.  The patient continues on Coumadin for DVT prophylaxis for 4 to 6 weeks with dose increased to 2 mg at time of discharge.  Home health BMP/INR to be drawn at Dr. Patty Sermons office on September 30, 1999.  By time of discharge the patients knee flexion remains at 66 to 68 degrees.  She is moderately independent for ADLs, modified independent for ambulating, and able to climb 12 to 14 stairs with close supervision.  Family is to provide intermittent supervision past discharge. Followup outpatient therapies has been set up at Dr. Joellyn Rued office.  On September 25, 1999, the patient is discharged to home.  DISCHARGE MEDICATIONS: 1. Coumadin 2 mg per day. 2. Pepcid 20 mg b.i.d. 3. Trinsicon one p.o. b.i.d. 4. Diovan 100  mg. 5. Hydrochlorothiazide 150/25 mg per day. 6. Norvasc 5 mg q.d. 7. ______  0.625 mg q.d. 8. Oxycodone CR one p.o. q.12h. p.r.n. pain. 9. Oxycodone IR one or two p.o. q.4-6h. p.r.n. pain.  ACTIVITY:  To use walker.  DIET:  Regular.  WOUND CARE:  Keep area clean and dry.  SPECIAL INSTRUCTIONS:  Outpatient therapies to begin in Dr. Joellyn Rued office. No alcohol, no smoking, no driving.  No aspirin or aspirin products, or NSAIDS while on Coumadin.  BMP/pro time on September 30, 1999, at Dr. Patty Sermons office.  FOLLOWUP: 1.  Dr. Darrelyn Hillock on Monday for staple removal. 2. Dr. Riley Kill as needed. DD:  11/30/99 TD:  12/01/99 Job: [id number]

## 2010-07-10 NOTE — H&P (Signed)
[facility]  Patient:    Heidi Morales, Heidi Morales                          MRN: [id number] Adm. Date:  09/16/99 Attending:  Georges Lynch. Darrelyn Hillock, M.D. Dictator:   Grayland Jack, P.A. CC:         Richard A. Jacky Kindle, M.D.                         History and Physical  DATE OF BIRTH:  06-26-1930  CHIEF COMPLAINT:  Right knee pain.  HISTORY OF PRESENT ILLNESS:  This 77 year old female has over a one-year history of severe right knee pain.  She has gone under a number of conservative treatments including oral anti inflamamtories, Synvisc injections as well as arthroscopic debridement.  X-rays reveal tricompartmental degenerative changes which were confirmed on arthroscopic evaluation.  Due to the patients continue increase in pain and disability and the pertinent findings on MRI and arthroscopic evaluation, it was felt that she would best benefit from surgical intervention at this time for a total knee replacement. The risks, benefit and complications of surgery have been discussed with the patient in detail.  ALLERGIES:  No known drug allergies.  CURRENT MEDICATIONS: 1. Ortho-Est 0.625 mg q.d. 2. Norvasc 5 mg q.d. 3. Diovan hydrochlorothiazide 160/12.5 mg q.d. 4. Motrin 800 mg q.d. 5. Multivitamin q.d.  PRIMARY CARE PHYSICIAN:  Richard A. Jacky Kindle, M.D.  PAST MEDICAL HISTORY: 1. Hypertension. 2. Macular degeneration.  PAST SURGICAL HISTORY: 1. Hysterectomy with appendectomy. 2. Bilateral vein stripping.  SOCIAL HISTORY:  The patient currently lives at home with her husband.  They reside in a two-story house with the bedrooms and bathroom being on the second floor.  The patient does have positive tobacco history of 1/2 pack per day with often one alcoholic beverage per day.  FAMILY HISTORY:  Significant for heart disease and CVA.  REVIEW OF SYSTEMS:  The patient does state that she feels like she is getting fever blister this morning.  GENERAL:   Otherwise, The patient denies fever, chills, weight changes or malaise.  HEENT: The patient denies headaches, visual changes, ear pain or pressure, nasal congestion or discharge, sore throat or dysphagia.  RESPIRATORY: The patient denies shortness of breath, productive cough or hemoptysis.  CARDIAC: The patient denies chest pain, palpitations, angina, or dyspnea on exertion.  GASTROINTESTINAL: The patient denies abdominal pain, nausea, vomiting, constipation, diarrhea or bloody stools.  GENITOURINARY: The patient denies dysuria, hematuria, increased frequency or hesitancy.  MUSCULOSKELETAL: See HPI.  HEMATOLOGIC: The patient denies history of anemia or bleeding tendencies.  PHYSICAL EXAMINATION:  VITAL SIGNS:  Temperature afebrile, pulse 96, respirations 18, blood pressure 140/85.  GENERAL:  Well-developed, well-nourished 77 year old female in no acute distress.  HEENT:  Normocephalic and atraumatic.  Pupils equal, round and reactive to light.  Extraocular muscles intact.  NECK:  Supple.  No JVD.  No lymphadenopathy.  No carotid bruits appreciated.  CHEST:  Clear to auscultation.  No rales or rhonchi.  No wheezing appreciated.  HEART:  Regular rate and rhythm.  No murmurs, rubs, or gallops appreciated.  ABDOMEN:  Positive bowel sounds.  Soft and nontender.  No organomegaly appreciated.  BREASTS, RECTAL AND GENITOURINARY:  Not done.  Not pertinent to the present illness.  EXTREMITIES:  The patient does have decreased active range of motion of the right knee.  She is tender to palpation along the medial  joint line.  SHe does have crepitus with motion.  IMPRESSION: 1. Osteoarthritis of the right knee. 2. Hypertension. 3. Macular degeneration.  PLAN:  The patient will be admitted to [facility] to undergo a right total knee arthroplasty by Dr. Ranee Gosselin.  The patient has donated two units of autologous blood, which will be available if needed.   The postoperative course including physical therapy has been discussed with the patient in detail.  All questions have been encouraged an answered. DD:  09/10/99 TD:  09/10/99 Job: [id number]

## 2010-07-10 NOTE — Op Note (Signed)
[place]. [facility]  Patient:    Heidi Morales, GUDGEL                        MRN: [id number] Proc. Date: 09/16/99 Adm. Date:  [id number] Attending:  Skip Mayer CC:         Corrie Dandy, M.D.                           Operative Report  PREOPERATIVE DIAGNOSIS:  Severe degenerative arthritis of the right knee.  POSTOPERATIVE DIAGNOSIS:  Severe degenerative arthritis of the right knee.  OPERATION:  Right total knee arthroplasty utilizing the [employer] system.  We utilized, first of all, the posterior cruciate sacrificing type prosthesis in all three complements were cemented.  The sizes used were as follows: 1.  The patella was a size 26. 2.  The femoral component was a size 5 right. 3.  The tibial tray was a size 5.  The tibial insert was a 12 mm thickness.  PROCEDURE IN DETAIL:  Under spinal anesthesia, routine orthopedic prep and drape of the right lower extremity was carried out.  At this time, the patient had one gram of IV Ancef preop.  After sterile prepping and draping were carried out, we then utilized the Esmarch before the tourniquet was elevated. The tourniquet was then elevated at 350 mm Hg.  An incision then was made down the midline in the usual fashion to the right knee.  Bleeders were identified and cauterized.  Two flaps were created and sutured in place.  Following this, we carried out a median parapatellar incision.  We reflected the patella laterally.  We then flexed the knee, carried out medial and lateral meniscectomies and excised the anterior-posterior cruciate ligaments. Her entire medial aspect of her knee was completely worn out.  She had significant arthritis within the knee.  We thoroughly irrigated out the knee. Our initial drill hole was made in the intercondylar notch.  A #1 jig was inserted, and we carried out our initial distal femoral cut at 10 mm thickness.  Once this was carried out, we then inserted a #2 jig  and this wall for a size 5 right femur.  We then carried out our anterior, posterior and chamfer cuts.  After the femur was prepared, we then prepared our tibia in the usual fashion. We removed 4 mm thickness off the affected medial side of her tibia.  After this cut was made, we then made our box cut in the femur for a posterior cruciate stabilized right knee.  Once the box cut was done, we then inserted our trial components and at this time we went through range of motion and felt that the most stable trial that we used was a size 12 mm thickness.  The trials then were removed and we made our keel cut into the tibial plateau region in the usual fashion.  We then utilized two pins into the medial femoral condyle for fixation purposes.  We then prepared our patella.  We removed 10 mm thickness from the articular surface of the patella and we utilized a size 26 patella.  We thoroughly irrigated out the knee, dried the knee out, and then cemented all three components in simultaneously.  All loose pieces of cement were removed. We thoroughly water pikd the knee numerous times.  Once this was carried out, removed our trial tibial insert tray and inspected  the knee to make sure there were no other loose pieces of cement in the posterior compartment, and there were not.  We then bone waxed the intercondylar notch in the usual fashion.  We thoroughly irrigated out the knee, dried the knee out and inserted our permanent 12 mm thickness tibial insert.  We then took the knee through a range of motion.  No lateral release was necessary.  Her patella was stable and we inserted a Hemovac drain and closed the wound in layers in the usual fashion.  We closed the skin with metal clips.  Sterile Neosporin dressing was applied, and the patient left the operating room in satisfactory condition.  FOLLOWUP CARE:  She will receive another gram of Ancef in the recovery room. She also will be started on her  Coumadin and subcu Heparin in the usual fashion. DD:  09/16/99 TD:  09/17/99 Job: [id number]

## 2010-07-10 NOTE — Assessment & Plan Note (Signed)
[facility]                             PULMONARY OFFICE NOTE   Heidi Morales, Heidi Morales                        MRN:          [id number]  DATE:06/17/2006                            DOB:          [birth year]    CHIEF COMPLAINT:  Evaluate dyspnea.   HISTORY OF PRESENT ILLNESS:  This is a 77 year old white female whose  been dyspneic now since January 2008 after she had a myocardial  infarction and stent placement. She denies any cough, she is shortness  of breath with exertion if she walks 10 minutes or longer, worse going  up hill. She is not shortness of breath at rest. There is no nocturnal  shortness of breath, there is no post nasal drainage, no edema of the  feet. She has no productive cough. She has no real chest pain. She does  have occasional chest tightness, no difficulty swallowing, no sore  throat, no nasal congestion, sneezing, itching, earache, anxiety,  depression, foot or hand swelling. She smoked for 55 year a pack a day,  quit smoking in January 2008. 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   PAST MEDICAL HISTORY:  Hypertension. She had an MI in January 2008 with  a stent placement. No other medical problems. She had a hysterectomy in  1975, appendectomy in 1975, knee replacement in 2001, hysterectomy.   MEDICATION ALLERGIES:  None.   CURRENT MEDICATIONS:  1. Norvasc 2.5 mg daily.  2. Diovan 320 mg daily.  3. HCTZ 25 mg daily.  4. Zocor 20 mg daily.  5. Toprol XL 50 mg daily.  6. Plavix 75 mg daily.  7. Aspirin 325 mg daily.  8. Vitamins daily.  9. Citrucel daily.   SOCIAL HISTORY:  As noted above, retired Film/video editor, lives at home  with her husband.   FAMILY HISTORY:  Positive for heart disease in mother and sister.   REVIEW OF SYSTEMS:  Otherwise noncontributory.   PHYSICAL EXAMINATION:  VITAL SIGNS:  Temperature 98, blood pressure  112/70, pulse 74, saturation was 90%.  CHEST:  Showed distant  breath sounds, prolonged expiratory phase, no  wheeze or rhonchi were noted.  CARDIAC:  Regular rate and rhythm without S3, normal S1, S2.  ABDOMEN:  Soft, nontender.  EXTREMITIES:  Showed no edema, clubbing or venous disease.  SKIN:  Clear.  NEUROLOGIC:  Intact.  HEENT:  No jugular venous distention or lymphadenopathy. Oropharynx  clear.  NECK:  Supple.   LABORATORY DATA:  Pulmonary functions from June 02, 2006 revealed an  FEV1 of 87%, FEF 2575 only 31% of predicted. Total lung capacity 100% of  predicted. Diffusion capacity severely reduced at 29% of predicted.  Echocardiogram had been obtained on May 31, 2006 and showed normal left  ventricular function, mild pulmonary hypertension.   Chest x-rays have not yet been obtained on this patient.   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   PLAN:  Begin Spiriva 1 capsule daily. She was instructed as to its  proper use. Note is  made that with exertion after 3 laps she dropped her  saturations to 86% on room air. At this point, we will hold off on home  oxygen therapy. We will see this patient back in return followup in 1  month. A chest x-ray today was obtained.     Charlcie Cradle Delford Field, MD, [facility]  Electronically Signed    PEW/MedQ  DD: 06/17/2006  DT: 06/17/2006  Job #: [id number]   cc:   Colleen Can. Deborah Chalk, M.D.  Geoffry Paradise, M.D.

## 2010-09-01 ENCOUNTER — Encounter: Payer: Self-pay | Admitting: Cardiovascular Disease

## 2010-09-08 ENCOUNTER — Encounter: Payer: Self-pay | Admitting: Cardiovascular Disease

## 2010-09-08 ENCOUNTER — Ambulatory Visit (INDEPENDENT_AMBULATORY_CARE_PROVIDER_SITE_OTHER): Payer: Medicare Other | Admitting: Cardiovascular Disease

## 2010-09-08 VITALS — BP 122/78 | HR 78 | Ht 64.0 in | Wt 143.8 lb

## 2010-09-08 DIAGNOSIS — J449 Chronic obstructive pulmonary disease, unspecified: Secondary | ICD-10-CM

## 2010-09-08 DIAGNOSIS — E785 Hyperlipidemia, unspecified: Secondary | ICD-10-CM

## 2010-09-08 DIAGNOSIS — I2510 Atherosclerotic heart disease of native coronary artery without angina pectoris: Secondary | ICD-10-CM | POA: Insufficient documentation

## 2010-09-08 MED ORDER — ATORVASTATIN CALCIUM 20 MG PO TABS: 20.0000 mg | ORAL_TABLET | Freq: Every day | ORAL | Status: DC

## 2010-09-08 NOTE — Assessment & Plan Note (Signed)
We'll plan on checking lipid profile soon.

## 2010-09-08 NOTE — Progress Notes (Signed)
Dollene Cleveland Date of Birth  1930-08-30 [facility] / [facility] [address]     [address] [place], [place]  [address] [phone]  Fax  [phone]  History of Present Illness:  Heidi Morales is an elderly female with a history of COPD and aortic stenosis. She also has a history of hypertension and hypercholesterolemia. She has not had any episodes of chest pain or shortness of breath since she last saw Dr. Deborah Chalk.  Current Outpatient Prescriptions on File Prior to Visit  Medication Sig Dispense Refill  . amLODipine (NORVASC) 5 MG tablet Take 5 mg by mouth daily.        Marland Kitchen aspirin 325 MG tablet Take 325 mg by mouth daily.        . Cholecalciferol (VITAMIN D PO) Take 1 tablet by mouth daily.        . hydrochlorothiazide (,MICROZIDE/HYDRODIURIL,) 12.5 MG capsule Take 12.5 mg by mouth daily.        . Multiple Vitamins-Minerals (ICAPS PO) Take 1 capsule by mouth 2 (two) times daily.       . nebivolol (BYSTOLIC) 5 MG tablet Take 10 mg by mouth daily.        Marland Kitchen tiotropium (SPIRIVA) 18 MCG inhalation capsule Place 18 mcg into inhaler and inhale as needed.       . valsartan (DIOVAN) 320 MG tablet Take 320 mg by mouth daily.          No Known Allergies  Past Medical History  Diagnosis Date  . COPD (chronic obstructive pulmonary disease)     COPD  . Hyperlipidemia   . Hypertension   . Heart attack 03/07/2006    [place]  . Aortic stenosis   . Ischemic heart disease   . Dyspnea   . H/O: hysterectomy     at age 85    Past Surgical History  Procedure Date  . Total knee arthroplasty 2001    right knee  . Bunionectomy 1982  . Varicose vein surgery     at age 42    History  Smoking status  . Former Smoker  . Quit date: 02/22/2006  Smokeless tobacco  . Not on file    History  Alcohol Use No    Family History  Problem Relation Age of Onset  . Heart disease Mother     myocardial infarction  . Hypertension Mother   . Hypertension Father     . Heart disease Sister 44    myocardial infarction  . Hypertension Sister   . Cancer Sister     bone  . Cancer Sister     bone  . Arthritis Father     Reviw of Systems:  Reviewed in the HPI.  All other systems are negative.  Physical Exam: BP 122/78  Pulse 78  Ht 5\' 4"  (1.626 m)  Wt 143 lb 12.8 oz (65.227 kg)  BMI 24.68 kg/m2  SpO2 93% The patient is alert and oriented x 3.  The mood and affect are normal.   Skin: warm and dry.  Color is normal.    HEENT:   the sclera are nonicteric.  The mucous membranes are moist.  The carotids are 2+ without bruits.  There is no thyromegaly.  There is no JVD.    Lungs: clear.  The chest wall is non tender.    Heart: regular rate with a normal S1 and S2.  There are no murmurs, gallops, or rubs. The PMI is not  displaced.     Abdomin: good bowel sounds.  There is no guarding or rebound.  There is no hepatosplenomegaly or tenderness.  There are no masses.   Extremities:  no clubbing, cyanosis, or edema.  The legs are without rashes.  The distal pulses are intact.   Neuro:  Cranial nerves II - XII are intact.  Motor and sensory functions are intact.    The gait is normal.  Assessment / Plan:

## 2010-09-08 NOTE — Assessment & Plan Note (Signed)
She has a history of PTCA and stenting several years ago while up in [place]. She's not had any episodes of angina.

## 2010-09-23 ENCOUNTER — Other Ambulatory Visit (HOSPITAL_COMMUNITY): Payer: Self-pay | Admitting: Specialist

## 2010-09-23 DIAGNOSIS — Z1231 Encounter for screening mammogram for malignant neoplasm of breast: Secondary | ICD-10-CM

## 2010-09-30 ENCOUNTER — Ambulatory Visit (HOSPITAL_COMMUNITY)
Admission: RE | Admit: 2010-09-30 | Discharge: 2010-09-30 | Disposition: A | Discharge status: Home / Self Care | Payer: Medicare Other | Source: Ambulatory Visit | Attending: Specialist | Admitting: Specialist

## 2010-09-30 DIAGNOSIS — Z1231 Encounter for screening mammogram for malignant neoplasm of breast: Secondary | ICD-10-CM | POA: Insufficient documentation

## 2011-03-08 ENCOUNTER — Ambulatory Visit (INDEPENDENT_AMBULATORY_CARE_PROVIDER_SITE_OTHER): Payer: Medicare Other | Admitting: Cardiovascular Disease

## 2011-03-08 ENCOUNTER — Encounter: Payer: Self-pay | Admitting: Cardiovascular Disease

## 2011-03-08 VITALS — BP 136/79 | HR 74 | Ht 64.0 in | Wt 147.8 lb

## 2011-03-08 DIAGNOSIS — I2510 Atherosclerotic heart disease of native coronary artery without angina pectoris: Secondary | ICD-10-CM

## 2011-03-08 NOTE — Assessment & Plan Note (Signed)
She status post an old anteroseptal myocardial infarction.   she seems to be doing quite well. She's not had any episodes of angina. We'll see her again in 6 months for followup office visit and fasting lab.

## 2011-03-08 NOTE — Progress Notes (Signed)
Dollene Cleveland Date of Birth  1930-04-26 [facility]     [place] Office  [address]    [address]   [address] [place], [place]  [address]    [place], [place]  [address] [phone]  Fax  [phone]  [phone]  Fax [phone]   History of Present Illness:  Peony is a 78 yo with a hx of COPD and CAD.  She exercises at the gym 3 times a week. She denies any angina. She has chronic shortness breath and has dyspnea on exertion with exercise. She still able to do all of her housework. She does note some fatigue.  Current Outpatient Prescriptions on File Prior to Visit  Medication Sig Dispense Refill  . amLODipine (NORVASC) 5 MG tablet Take 5 mg by mouth daily.        Marland Kitchen aspirin 325 MG tablet Take 325 mg by mouth daily.        Marland Kitchen atorvastatin (LIPITOR) 20 MG tablet Take 1 tablet (20 mg total) by mouth daily.  30 tablet  11  . calcium citrate-vitamin D (CITRACAL+D) 315-200 MG-UNIT per tablet Take 1 tablet by mouth daily.        . Cholecalciferol (VITAMIN D PO) Take 1 tablet by mouth daily.        . hydrochlorothiazide (,MICROZIDE/HYDRODIURIL,) 12.5 MG capsule Take 12.5 mg by mouth daily.        . Multiple Vitamins-Minerals (ICAPS PO) Take 1 capsule by mouth 2 (two) times daily.       . nebivolol (BYSTOLIC) 5 MG tablet Take 10 mg by mouth daily.        Marland Kitchen tiotropium (SPIRIVA) 18 MCG inhalation capsule Place 18 mcg into inhaler and inhale as needed.       . valsartan (DIOVAN) 320 MG tablet Take 320 mg by mouth daily.          Allergies  Allergen Reactions  . Phenergan Other (See Comments)    Drop in Blood pressure    Past Medical History  Diagnosis Date  . COPD (chronic obstructive pulmonary disease)     COPD  . Hyperlipidemia   . Hypertension   . Heart attack 03/07/2006    [place]  . Aortic stenosis   . Ischemic heart disease   . Dyspnea   . H/O: hysterectomy     at age 72  . Arthritis     End-stage arthritis of the left knee  . Emphysema    . Heart disease   . Hyponatremia     postoperative  . Hypokalemia     postoperative  . Leukocytosis   . Acute posthemorrhagic anemia     Past Surgical History  Procedure Date  . Total knee arthroplasty 2001    right knee  . Bunionectomy 1982  . Varicose vein surgery     at age 58--Bilateral vein stripping    History  Smoking status  . Former Smoker  . Quit date: 02/22/2006  Smokeless tobacco  . Not on file    History  Alcohol Use No    Family History  Problem Relation Age of Onset  . Heart disease Mother     myocardial infarction  . Hypertension Mother   . Hypertension Father   . Heart disease Sister 70    myocardial infarction  . Hypertension Sister   . Cancer Sister     bone  . Cancer Sister     bone  . Arthritis Father  Reviw of Systems:  Reviewed in the HPI.  All other systems are negative.  Physical Exam: BP 136/79  Pulse 74  Ht 5\' 4"  (1.626 m)  Wt 147 lb 12.8 oz (67.042 kg)  BMI 25.37 kg/m2 The patient is alert and oriented x 3.  The mood and affect are normal.   Skin: warm and dry.  Color is normal.    HEENT:   Normocephalic/atraumatic. Mucous membranes are moist. There is no JVD. Carotids are normal.  Lungs: few wheezes   Heart: Regular rate S1-S2.    Abdomen: Good bowel sounds. Her abdomen is nontender to  Extremities:  No clubbing cyanosis or edema.  Neuro:  Her exam is nonfocal. Her gait is normal.    ECG: NSR, septal MI - old.  No acute ST /T wave changes  Assessment / Plan:

## 2011-03-08 NOTE — Patient Instructions (Signed)
Your physician wants you to follow-up in: 6 months  You will receive a reminder letter in the mail two months in advance. If you don't receive a letter, please call our office to schedule the follow-up appointment.  Your physician recommends that you return for a FASTING lipid profile: 6 months   

## 2011-04-09 DIAGNOSIS — H3530 Unspecified macular degeneration: Secondary | ICD-10-CM | POA: Insufficient documentation

## 2011-06-29 ENCOUNTER — Other Ambulatory Visit: Payer: Self-pay | Admitting: Cardiovascular Disease

## 2011-06-29 DIAGNOSIS — I2510 Atherosclerotic heart disease of native coronary artery without angina pectoris: Secondary | ICD-10-CM

## 2011-06-29 MED ORDER — ATORVASTATIN CALCIUM 20 MG PO TABS: 20.0000 mg | ORAL_TABLET | Freq: Every day | ORAL | Status: DC

## 2011-06-29 NOTE — Telephone Encounter (Signed)
Refill- Atorvastatin (LIPITOR) 20 MG tablet    Changed preferred to [employer] on [address] per husband Shalene Gallen.

## 2011-10-29 DIAGNOSIS — H5210 Myopia, unspecified eye: Secondary | ICD-10-CM | POA: Insufficient documentation

## 2011-10-29 DIAGNOSIS — H3533 Angioid streaks of macula: Secondary | ICD-10-CM | POA: Insufficient documentation

## 2011-12-16 ENCOUNTER — Other Ambulatory Visit (HOSPITAL_COMMUNITY): Payer: Self-pay | Admitting: Internal Medicine

## 2011-12-16 DIAGNOSIS — Z1231 Encounter for screening mammogram for malignant neoplasm of breast: Secondary | ICD-10-CM

## 2012-01-06 ENCOUNTER — Ambulatory Visit (HOSPITAL_COMMUNITY)
Admission: RE | Admit: 2012-01-06 | Discharge: 2012-01-06 | Disposition: A | Discharge status: Home / Self Care | Payer: Medicare Other | Source: Ambulatory Visit | Attending: Internal Medicine | Admitting: Internal Medicine

## 2012-01-06 DIAGNOSIS — Z1231 Encounter for screening mammogram for malignant neoplasm of breast: Secondary | ICD-10-CM | POA: Insufficient documentation

## 2012-06-05 ENCOUNTER — Encounter: Payer: Self-pay | Admitting: Cardiology

## 2012-06-08 DIAGNOSIS — Z9889 Other specified postprocedural states: Secondary | ICD-10-CM | POA: Insufficient documentation

## 2012-06-08 DIAGNOSIS — H353190 Nonexudative age-related macular degeneration, unspecified eye, stage unspecified: Secondary | ICD-10-CM | POA: Insufficient documentation

## 2012-06-16 IMAGING — CR DG CHEST 2V
2 series · 2 of 2 positions shown · non-contrast
Comparison: 01/02/2009

CLINICAL DATA: Preoperative respiratory exam for knee surgery.
History of cardiac disease.  Hypertension.  Smoking history.

CHEST - 2 VIEW

[w chest pa]
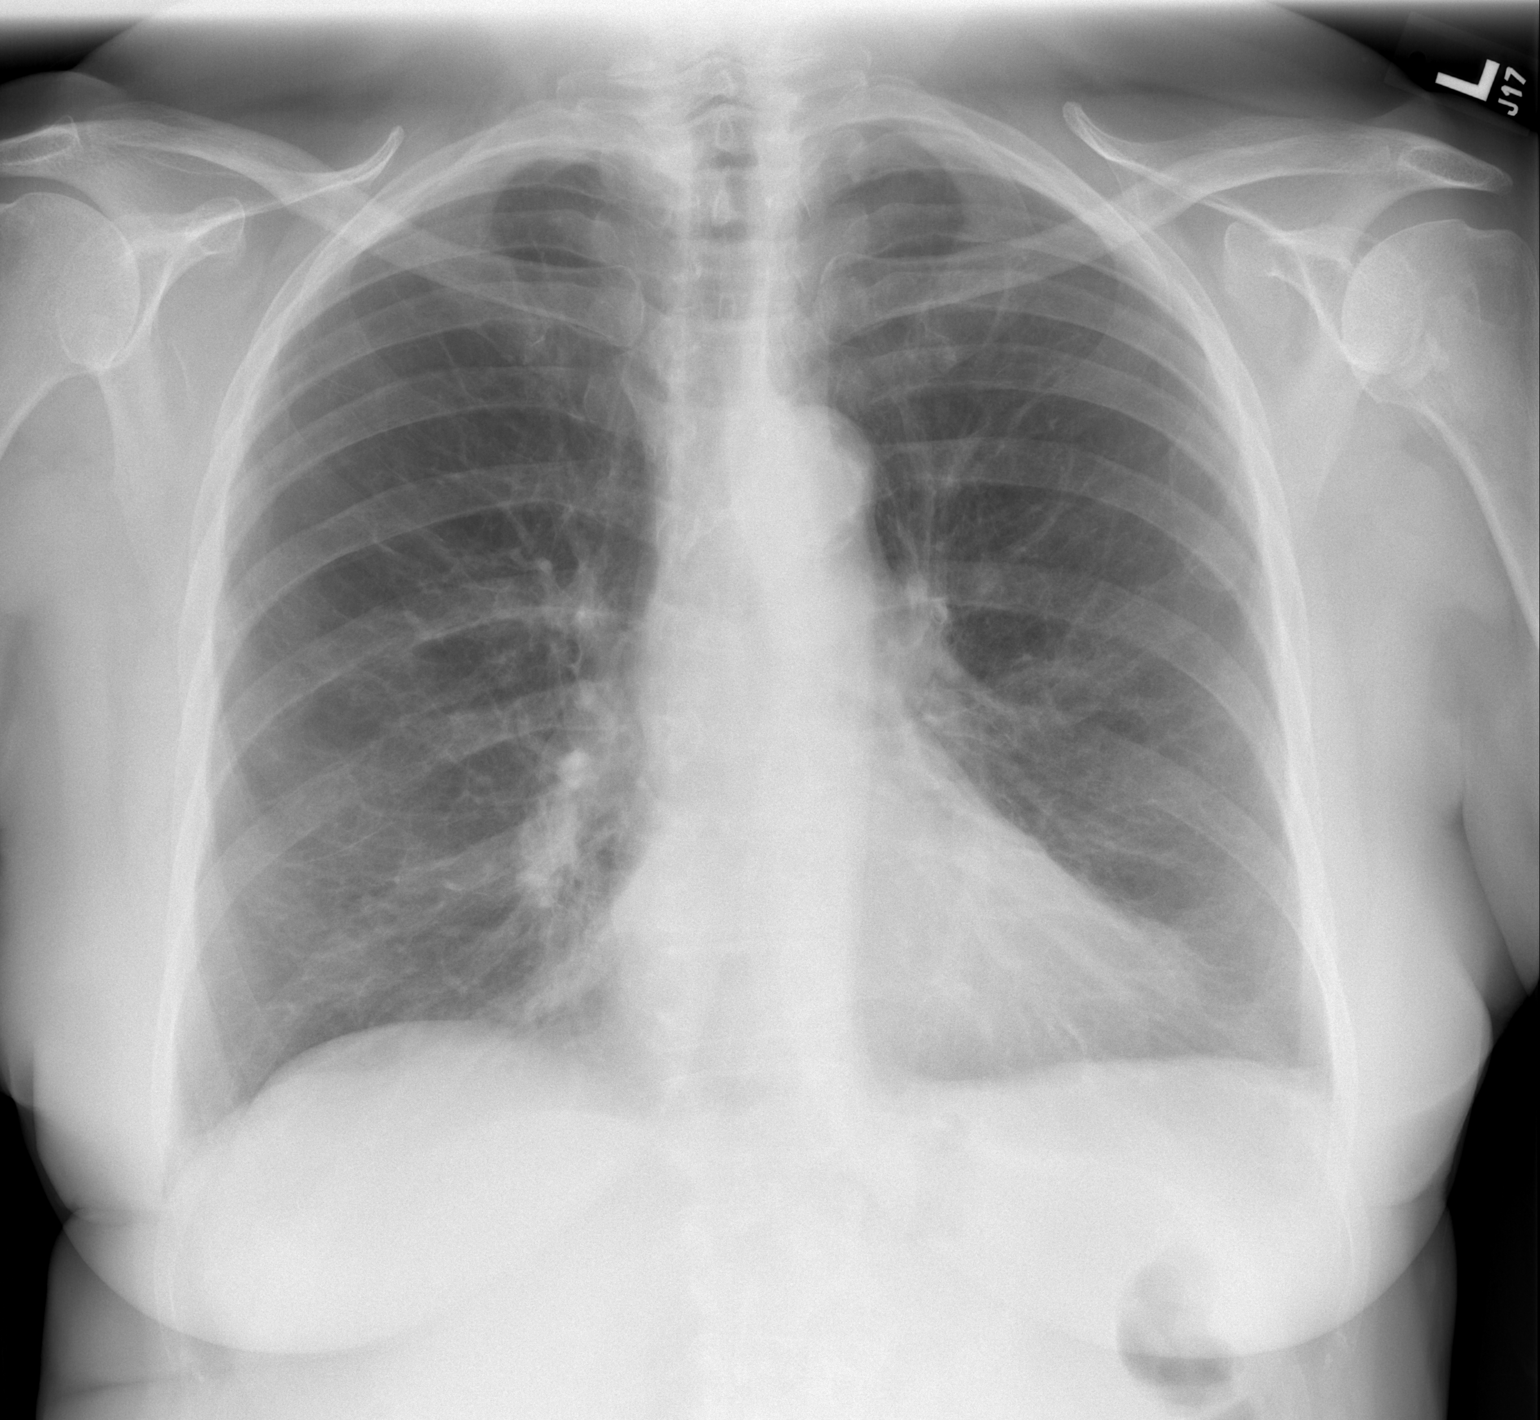

[w chest lat]
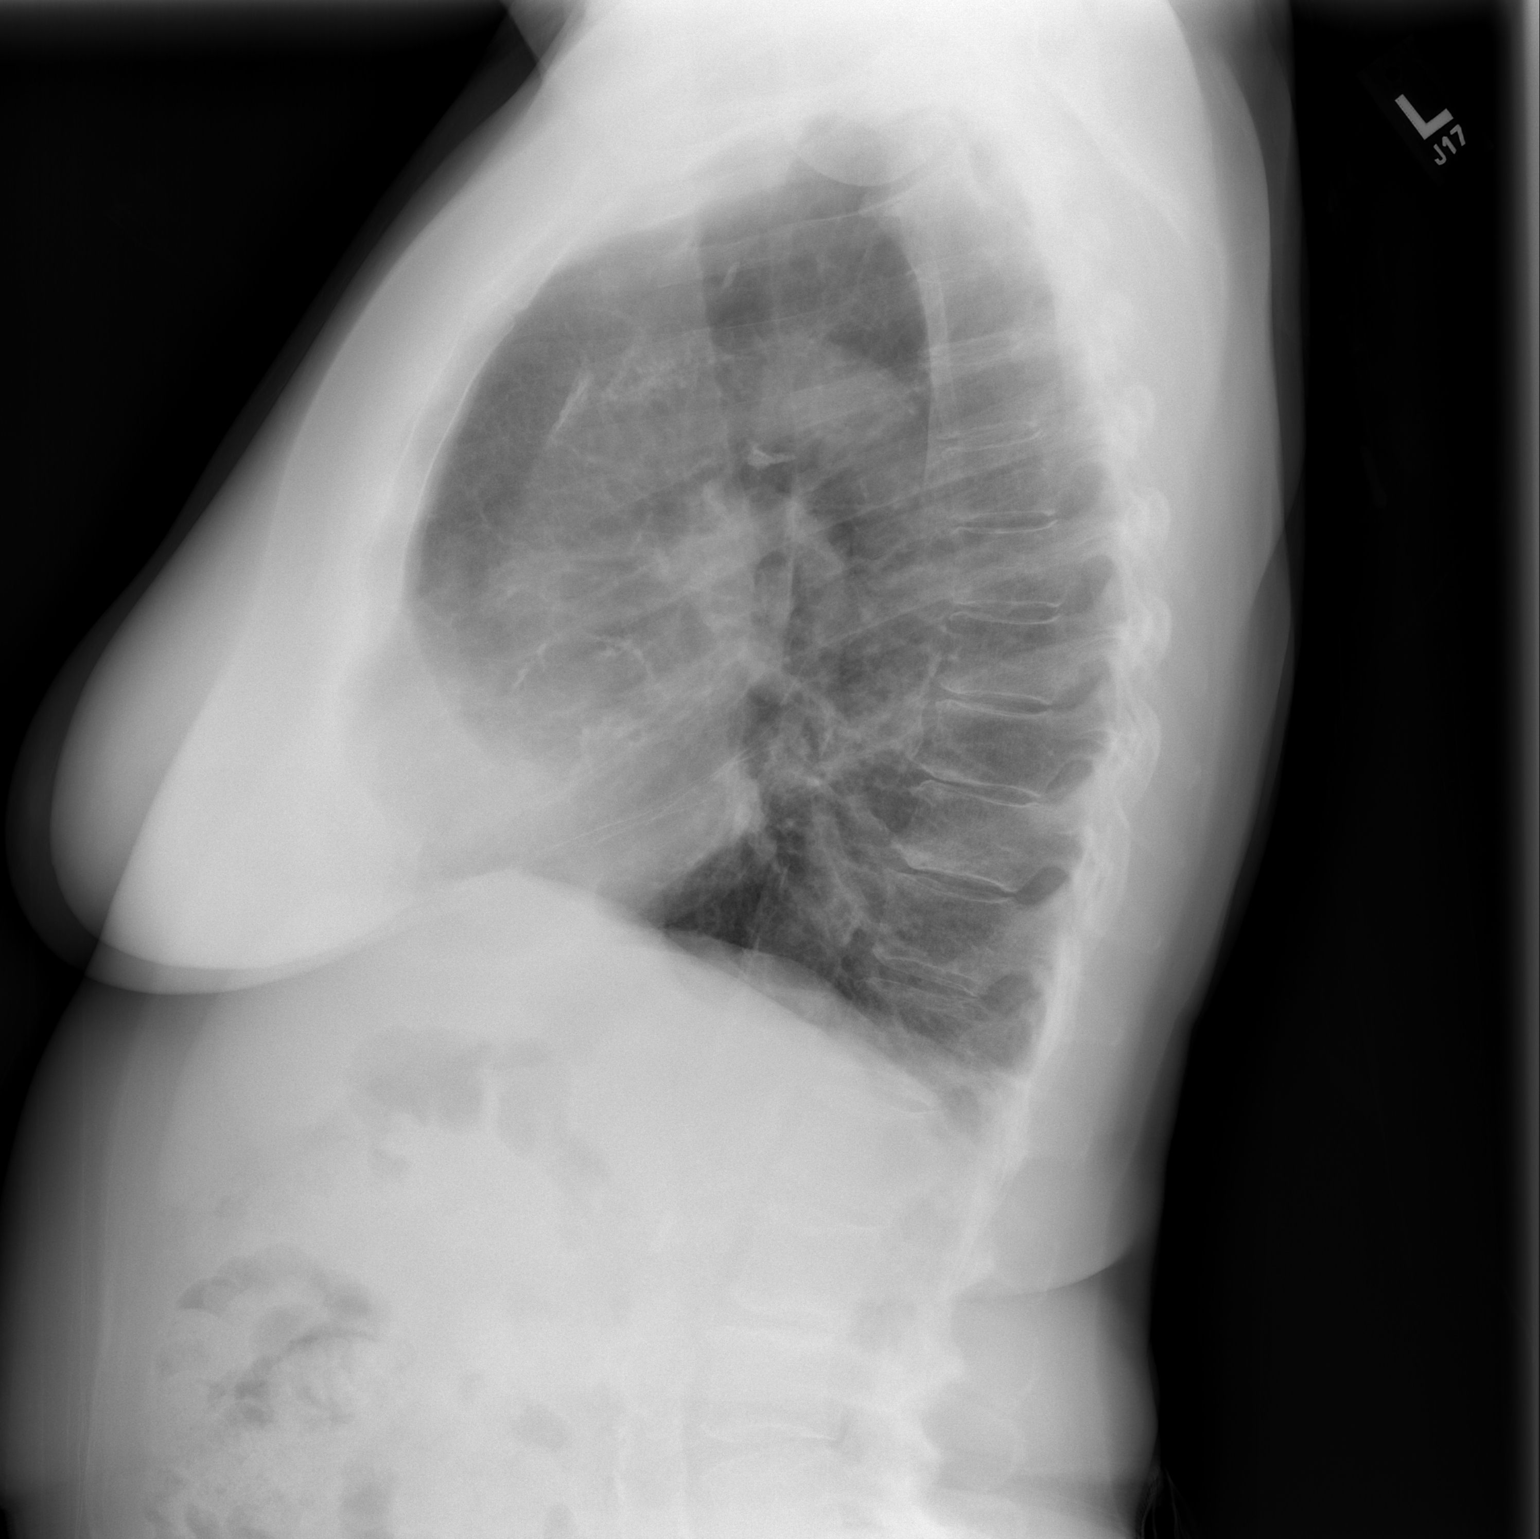

[2 of 2 positions shown; findings below may reference images not displayed]

FINDINGS: Heart size is normal.  Mediastinal shadows are normal.
There is chronic interstitial pulmonary prominence but no evidence
of active infiltrate, mass, effusion or collapse.  There is
atherosclerosis of the aorta.
IMPRESSION: Chronic atherosclerosis.  Chronic interstitial pulmonary
prominence.  No active disease identified.

## 2012-06-18 IMAGING — CR DG KNEE 1-2V PORT*L*
1 series · 2 of 2 positions shown · non-contrast
Comparison: None

CLINICAL DATA: Total knee replacement

PORTABLE LEFT KNEE - 1-2 VIEW

[Series 1: AP · left · 2 of 2 slices shown]
[im 1/2]
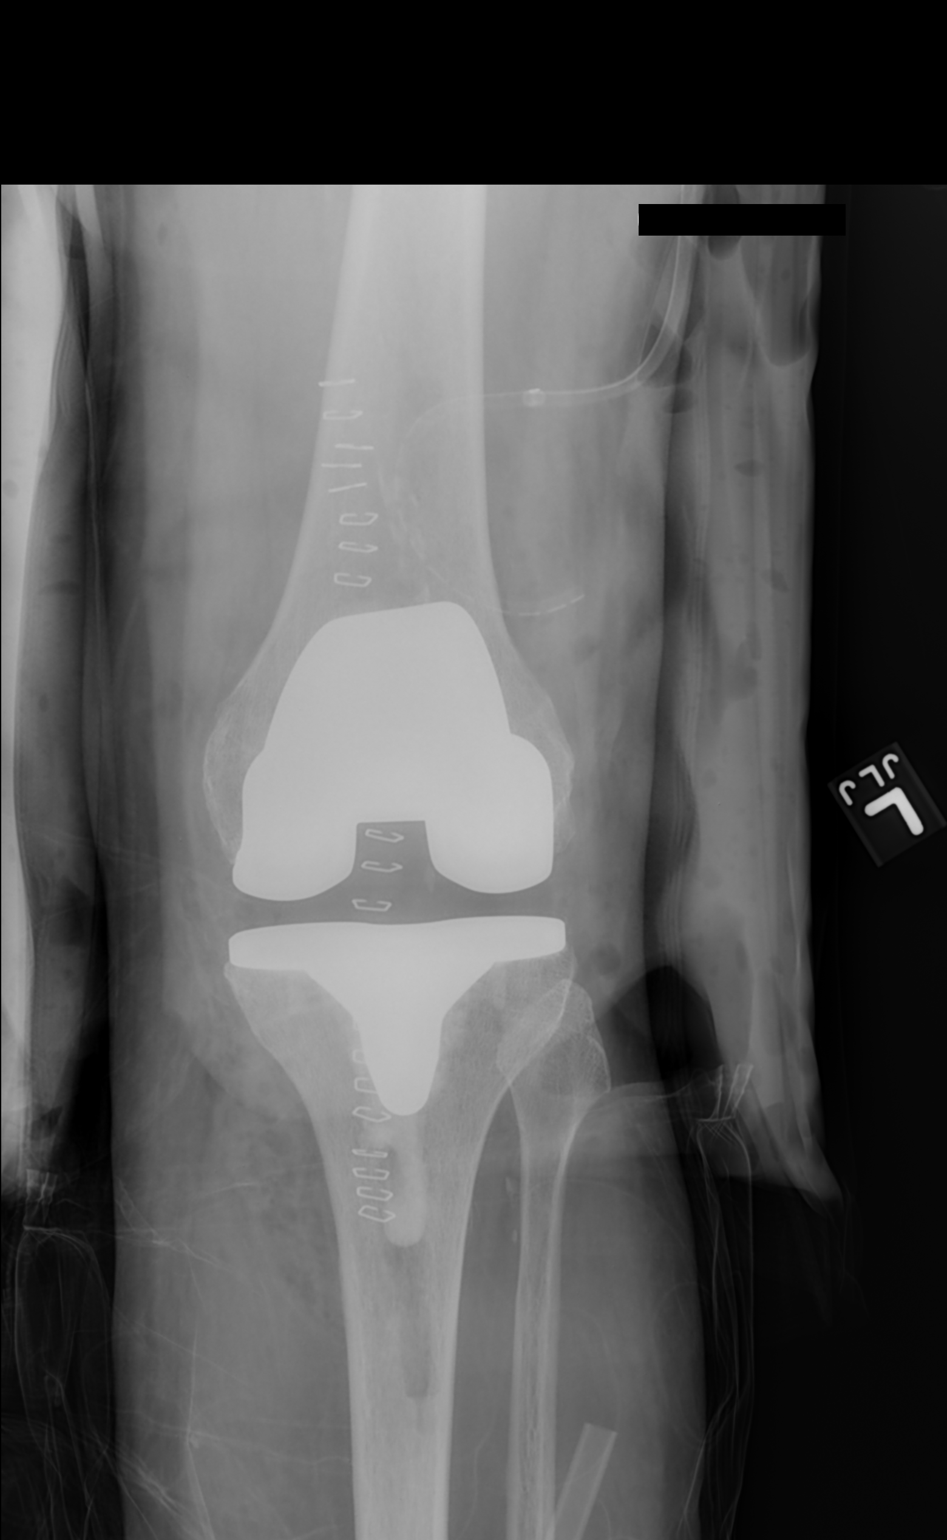
[im 2/2]
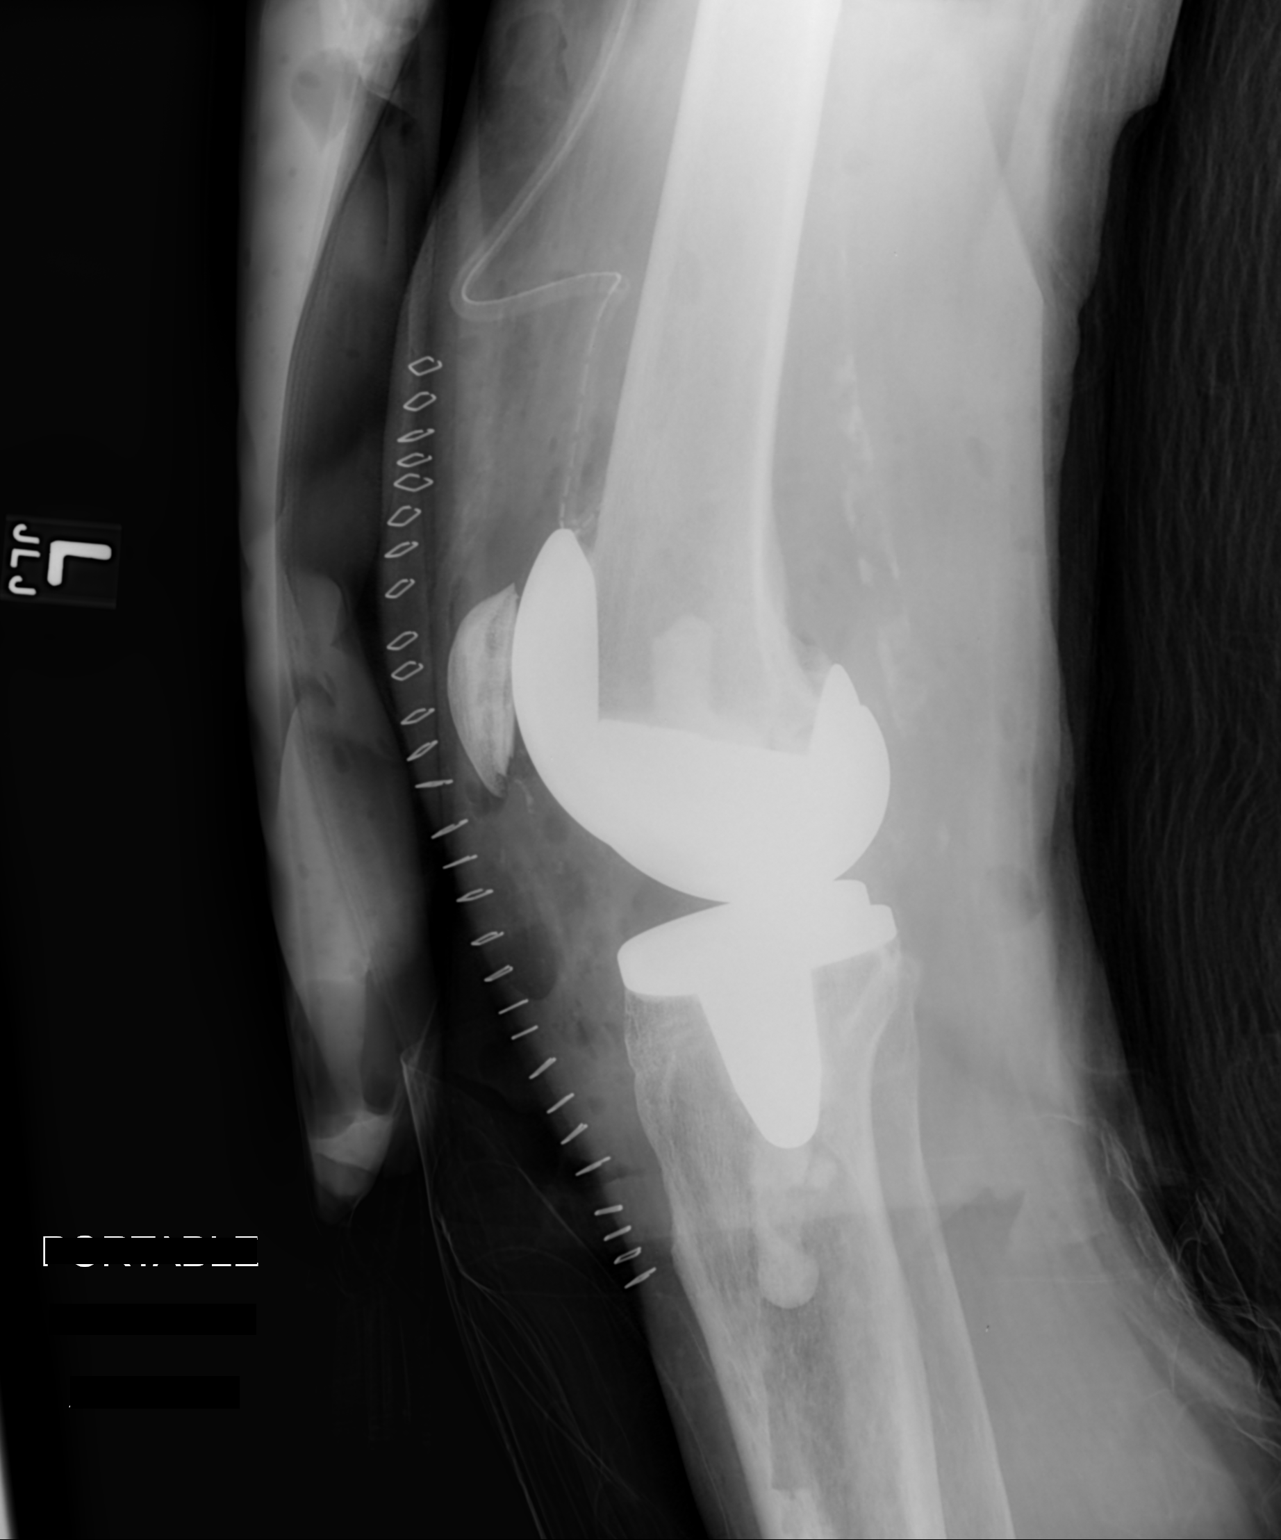

[2 of 2 positions shown; findings below may reference images not displayed]

FINDINGS: Total knee replacement is in satisfactory position and
alignment.  Drain is present in the suprapatellar bursa.  Gas is
present in the soft tissues.

Negative for fracture or acute abnormality.
IMPRESSION: Satisfactory right knee replacement.

## 2012-06-18 IMAGING — CT CT ANGIO CHEST
2 of 6 series · 19 of 36 positions shown · IV contrast (APPLIED)
Comparison: 06/10/2010.

CLINICAL DATA: Short of breath, rule out pulmonary embolism.  Knee
replacement.

CT ANGIOGRAPHY CHEST WITH CONTRAST
TECHNIQUE: Multidetector CT imaging of the chest was performed
using the standard protocol during bolus administration of
intravenous contrast.  Multiplanar CT image reconstructions
including MIPs were obtained to evaluate the vascular anatomy.
Contrast:  80 ml [id number]-VZZ IV

[Series 6: pe thins @ 1mm · axial · 0.76mm/px · z∈[-179,+37]mm · 18 of 241 slices shown]
[im 13/241  lung]
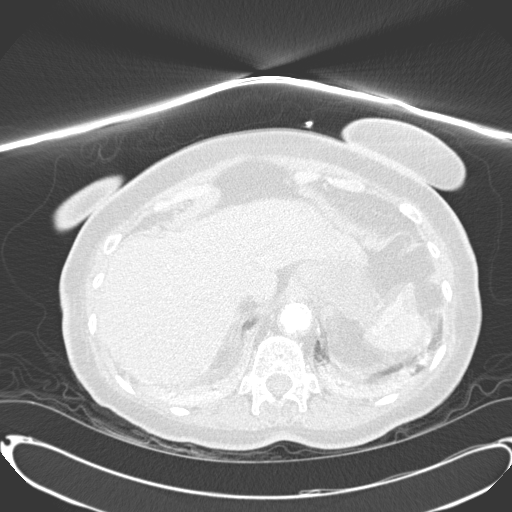
[im 25/241  mediastinal]
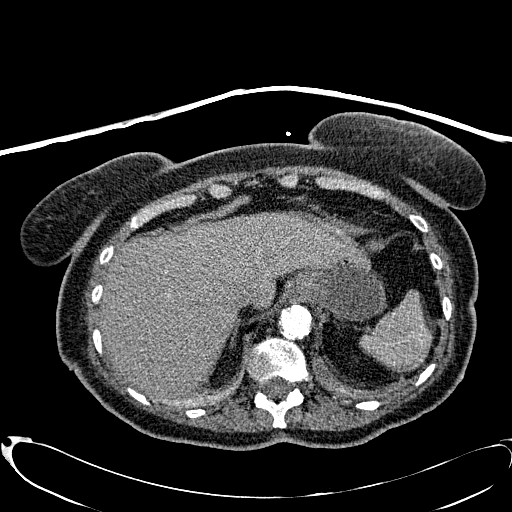
[im 37/241  lung]
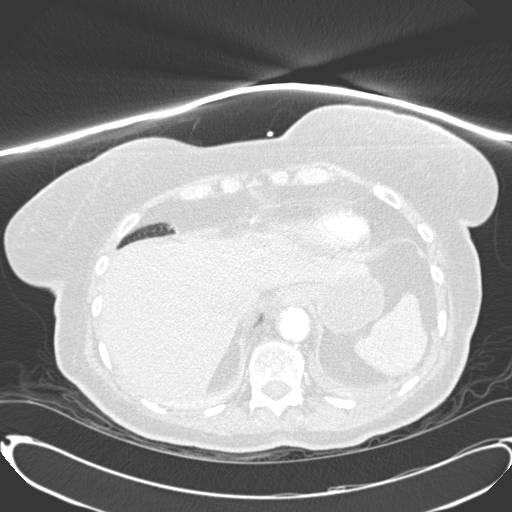
[im 49/241  mediastinal]
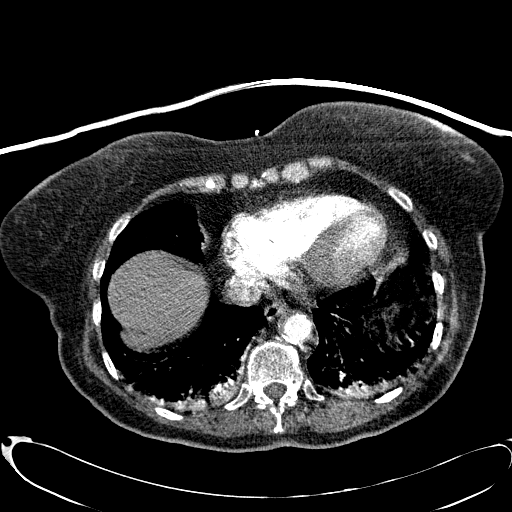
[im 61/241  lung]
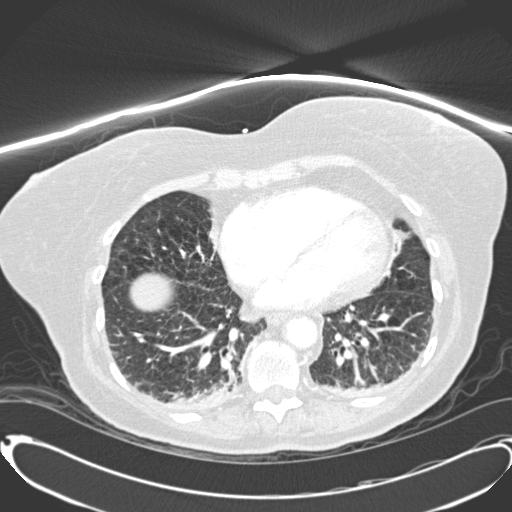
[im 73/241  mediastinal]
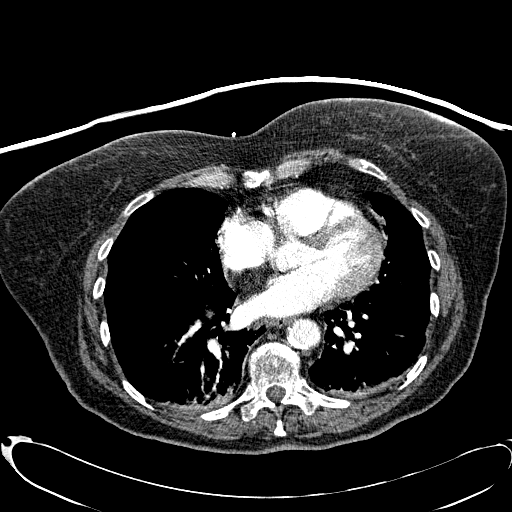
[im 85/241  lung]
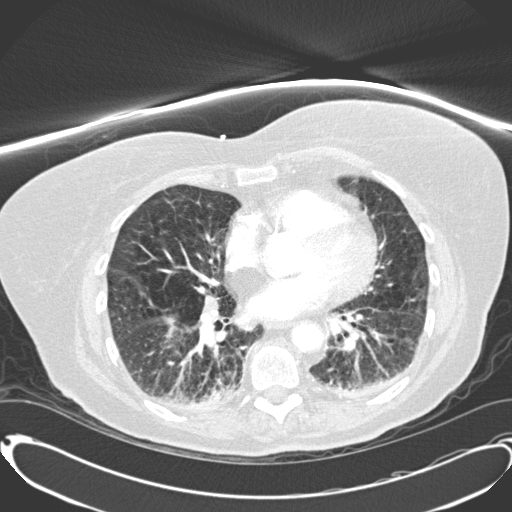
[im 97/241  mediastinal]
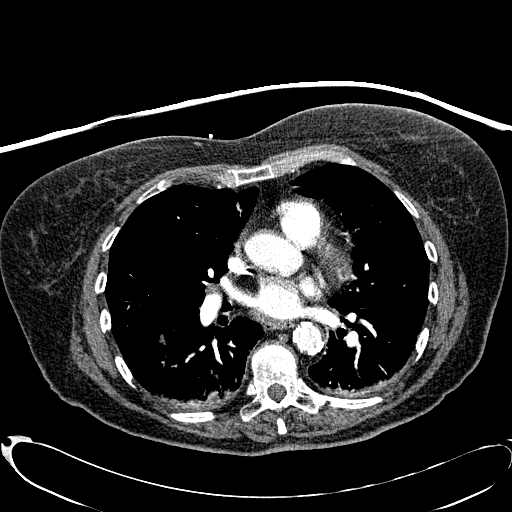
[im 109/241  lung]
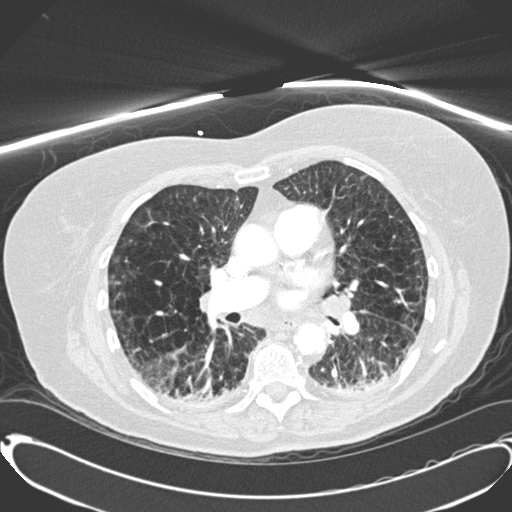
[im 133/241  mediastinal]
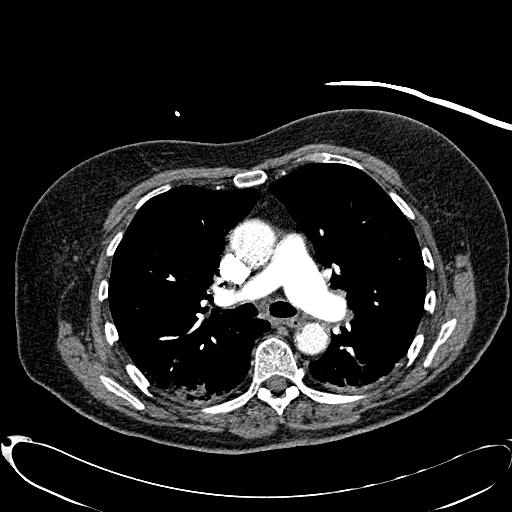
[im 145/241  lung]
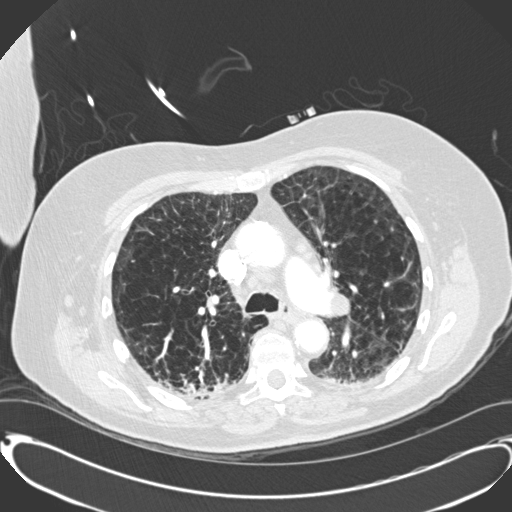
[im 157/241  mediastinal]
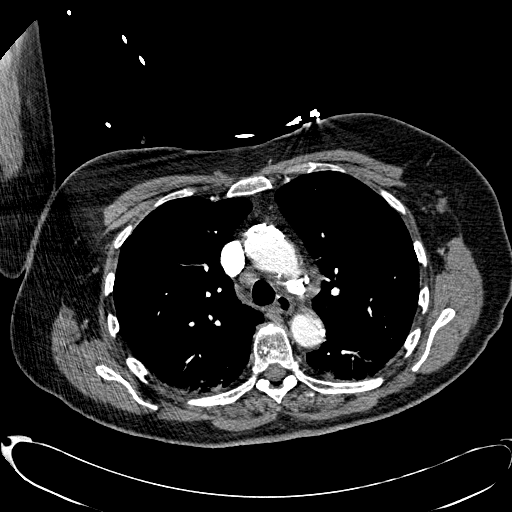
[im 169/241  lung]
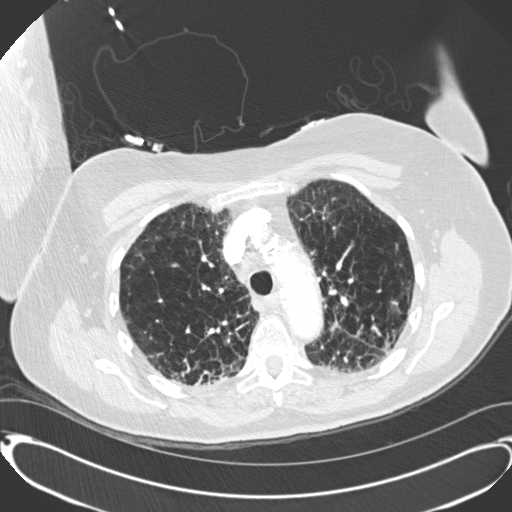
[im 181/241  mediastinal]
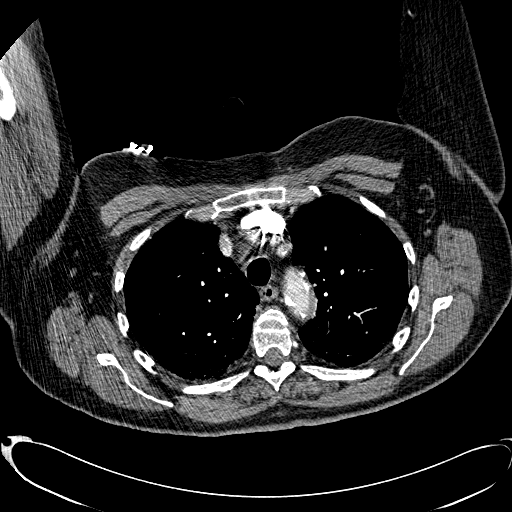
[im 193/241  lung]
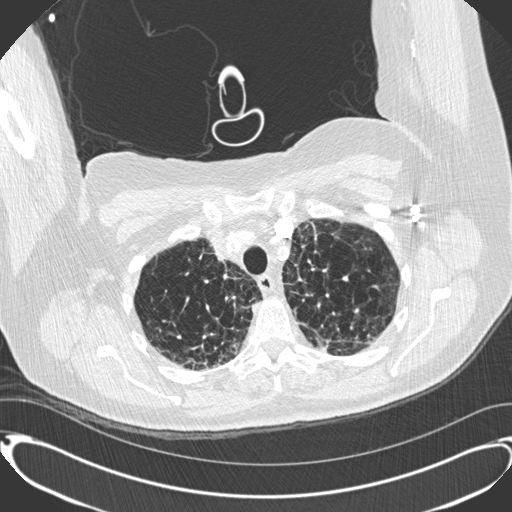
[im 205/241  mediastinal]
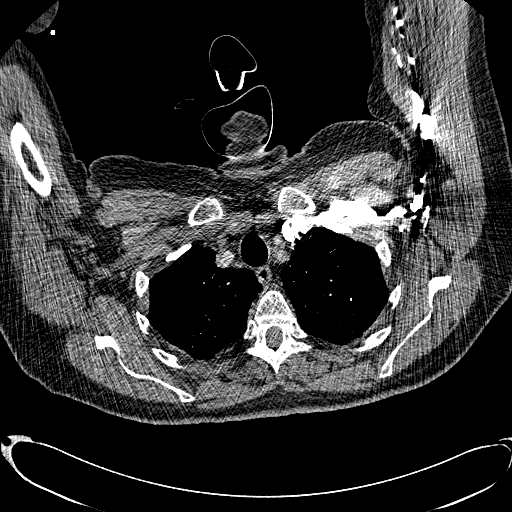
[im 217/241  lung]
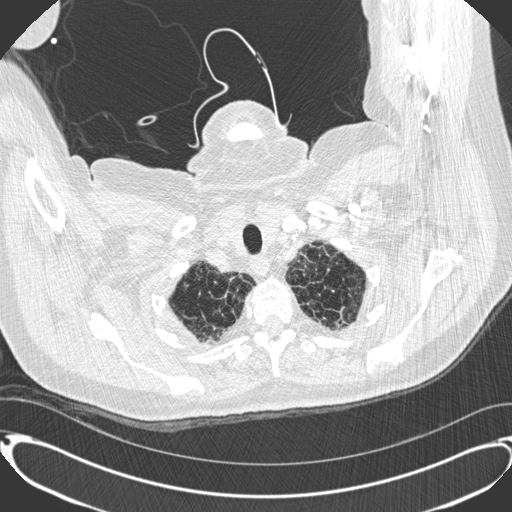
[im 229/241  mediastinal]
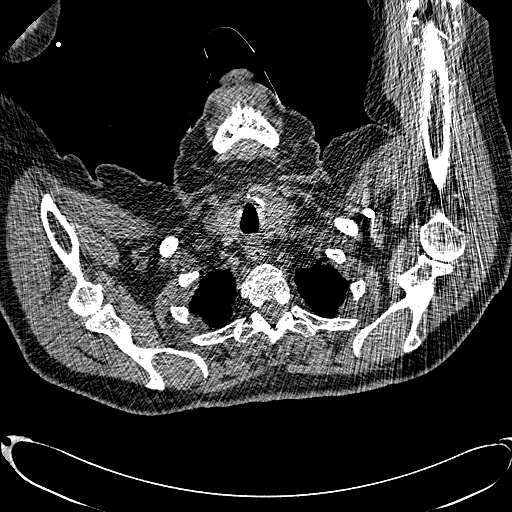

[Series 602: <mpr thick range> · coronal · 0.76mm/px · 1 of 136 slices shown]
[im 68/136  mediastinal]
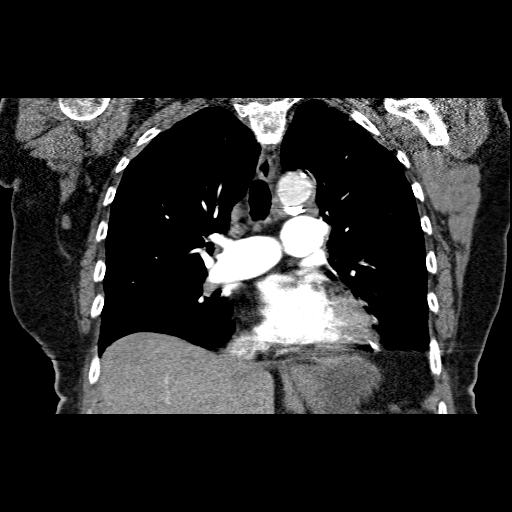

[19 of 36 positions shown; findings below may reference images not displayed]

FINDINGS: Negative for pulmonary embolism.  Atherosclerotic
disease in the aorta without aneurysm or dissection.  Heart size is
normal.  Coronary artery calcification is present.

Moderate to severe emphysema is present bilaterally.  Bibasilar
atelectasis is present.  No significant pleural effusion.

Mediastinal lymph nodes are present.  Right paratracheal node
measures 12 mm.  Prevascular nodes measure approximately 10 mm.
Left hilar lymph nodes are present measuring up to 13 mm and 17 mm.
Right hilar lymph nodes measure 11 mm and 9 mm.  No acute bony
abnormality.

Review of the MIP images confirms the above findings.
IMPRESSION: Negative for pulmonary embolism.

Moderate to severe emphysema with mild bibasilar atelectasis.

Mediastinal and hilar adenopathy.  These may be reactive nodes
however neoplasm could have this appearance.

Aortic and coronary atherosclerotic disease.

## 2012-09-01 ENCOUNTER — Other Ambulatory Visit: Payer: Self-pay | Admitting: Internal Medicine

## 2012-09-01 ENCOUNTER — Ambulatory Visit
Admission: RE | Admit: 2012-09-01 | Discharge: 2012-09-01 | Disposition: A | Discharge status: Home / Self Care | Payer: Medicare Other | Source: Ambulatory Visit | Attending: Internal Medicine | Admitting: Internal Medicine

## 2012-09-01 DIAGNOSIS — M546 Pain in thoracic spine: Secondary | ICD-10-CM

## 2012-09-01 DIAGNOSIS — M549 Dorsalgia, unspecified: Secondary | ICD-10-CM

## 2012-09-01 MED ORDER — IOHEXOL 350 MG/ML SOLN
100.0000 mL | Freq: Once | INTRAVENOUS | Status: AC | PRN
  Administered 2012-09-01: 100 mL via INTRAVENOUS

## 2012-12-13 ENCOUNTER — Encounter: Payer: Self-pay | Admitting: *Deleted

## 2012-12-13 ENCOUNTER — Encounter: Payer: Self-pay | Admitting: Cardiology

## 2012-12-19 ENCOUNTER — Encounter (INDEPENDENT_AMBULATORY_CARE_PROVIDER_SITE_OTHER): Payer: Self-pay

## 2012-12-19 ENCOUNTER — Ambulatory Visit (INDEPENDENT_AMBULATORY_CARE_PROVIDER_SITE_OTHER): Payer: Medicare Other | Admitting: Cardiovascular Disease

## 2012-12-19 ENCOUNTER — Encounter: Payer: Self-pay | Admitting: Cardiovascular Disease

## 2012-12-19 VITALS — BP 120/89 | HR 77 | Ht 64.0 in | Wt 150.0 lb

## 2012-12-19 DIAGNOSIS — I2510 Atherosclerotic heart disease of native coronary artery without angina pectoris: Secondary | ICD-10-CM

## 2012-12-19 NOTE — Assessment & Plan Note (Signed)
Heidi Morales is doing well.  She has a history of coronary artery disease and has had a stent placed in 2000 and she lives in [place]. Her lipid levels are quite acceptable.  I will see  her again in approximately 18 months.  She sees Dr. Jacky Kindle approximately once a year in the fall. I'll see her in the spring time.  Pleased that she's not having any episodes of angina.

## 2012-12-19 NOTE — Patient Instructions (Addendum)
Your physician recommends that you continue on your current medications as directed. Please refer to the Current Medication list given to you today.  Your physician wants you to follow-up in: 1 year with Dr. Melburn Popper. You will receive a reminder letter in the mail two months in advance. If you don't receive a letter, please call our office to schedule the follow-up appointment.  Your physician recommends that you return for lab work in: 1 year fasting labs, BMET,Lipid, Liver

## 2012-12-19 NOTE — Progress Notes (Signed)
Heidi Morales Date of Birth  1930/11/17 [facility]     [place] Office  [address]    [address]   [address] [place], [place]  [address]    [place], [place]  [address] [phone]  Fax  [phone]  [phone]  Fax [phone]  Problem LIst 1. CAD - stenting in 2008 in [place] , [place] 2 COPD 3. HTN   History of Present Illness:  [name] is a 79 yo with a hx of COPD and CAD.  She exercises at the gym 3 times a week. She denies any angina. She has chronic shortness breath and has dyspnea on exertion with exercise. She still able to do all of her housework. She does note some fatigue.  She sees Jacky Kindle who watches her cholesterol.    Current Outpatient Prescriptions on File Prior to Visit  Medication Sig Dispense Refill  . amLODipine (NORVASC) 5 MG tablet Take 5 mg by mouth daily.        Marland Kitchen aspirin 325 MG tablet Take 325 mg by mouth daily.        Marland Kitchen atorvastatin (LIPITOR) 20 MG tablet Take 1 tablet (20 mg total) by mouth daily.  30 tablet  11  . calcium citrate-vitamin D (CITRACAL+D) 315-200 MG-UNIT per tablet Take 1 tablet by mouth daily.        . Cholecalciferol (VITAMIN D PO) Take 1 tablet by mouth daily.        . hydrochlorothiazide (,MICROZIDE/HYDRODIURIL,) 12.5 MG capsule Take 12.5 mg by mouth daily.        . Multiple Vitamins-Minerals (ICAPS PO) Take 1 capsule by mouth 2 (two) times daily.       . nebivolol (BYSTOLIC) 5 MG tablet Take 10 mg by mouth daily.        Marland Kitchen tiotropium (SPIRIVA) 18 MCG inhalation capsule Place 18 mcg into inhaler and inhale as needed.       . valsartan (DIOVAN) 320 MG tablet Take 320 mg by mouth daily.         No current facility-administered medications on file prior to visit.    Allergies  Allergen Reactions  . Promethazine Hcl Other (See Comments)    Drop in Blood pressure    Past Medical History  Diagnosis Date  . COPD (chronic obstructive pulmonary disease)     COPD  . Hyperlipidemia   . Hypertension   . Heart attack  03/07/2006    [place]  . Aortic stenosis   . Ischemic heart disease   . Dyspnea   . H/O: hysterectomy     at age 32  . Arthritis     End-stage arthritis of the left knee  . Emphysema   . Heart disease   . Hyponatremia     postoperative  . Hypokalemia     postoperative  . Leukocytosis   . Acute posthemorrhagic anemia     Past Surgical History  Procedure Laterality Date  . Total knee arthroplasty  2001    right knee  . Bunionectomy  1982  . Varicose vein surgery      at age 24--Bilateral vein stripping    History  Smoking status  . Former Smoker  . Quit date: 02/22/2006  Smokeless tobacco  . Not on file    History  Alcohol Use No    Family History  Problem Relation Age of Onset  . Heart disease Mother     myocardial infarction  . Hypertension Mother   .  Hypertension Father   . Heart disease Sister 48    myocardial infarction  . Hypertension Sister   . Cancer Sister     bone  . Cancer Sister     bone  . Arthritis Father     Reviw of Systems:  Reviewed in the HPI.  All other systems are negative.  Physical Exam: BP 120/89  Pulse 77  Ht 5\' 4"  (1.626 m)  Wt 150 lb (68.04 kg)  BMI 25.73 kg/m2 The patient is alert and oriented x 3.  The mood and affect are normal.   Skin: warm and dry.  Color is normal.    HEENT:   Normocephalic/atraumatic. Mucous membranes are moist. There is no JVD. Carotids are normal.  Lungs: few wheezes   Heart: Regular rate S1-S2.    Abdomen: Good bowel sounds. Her abdomen is nontender to  Extremities:  No clubbing cyanosis or edema.  Neuro:  Her exam is nonfocal. Her gait is normal.    ECG: Oct. 28, 2014:  NSR at 77, normal ECG   Assessment / Plan:

## 2013-01-02 ENCOUNTER — Other Ambulatory Visit (HOSPITAL_COMMUNITY): Payer: Self-pay | Admitting: Internal Medicine

## 2013-01-02 DIAGNOSIS — Z1231 Encounter for screening mammogram for malignant neoplasm of breast: Secondary | ICD-10-CM

## 2013-01-11 ENCOUNTER — Ambulatory Visit (HOSPITAL_COMMUNITY)
Admission: RE | Admit: 2013-01-11 | Discharge: 2013-01-11 | Disposition: A | Discharge status: Home / Self Care | Payer: Medicare Other | Source: Ambulatory Visit | Attending: Internal Medicine | Admitting: Internal Medicine

## 2013-01-11 DIAGNOSIS — Z1231 Encounter for screening mammogram for malignant neoplasm of breast: Secondary | ICD-10-CM | POA: Insufficient documentation

## 2013-09-12 ENCOUNTER — Encounter: Payer: Self-pay | Admitting: Cardiovascular Disease

## 2013-09-12 ENCOUNTER — Ambulatory Visit: Payer: Medicare Other | Admitting: Cardiovascular Disease

## 2013-09-12 ENCOUNTER — Ambulatory Visit (INDEPENDENT_AMBULATORY_CARE_PROVIDER_SITE_OTHER): Payer: Medicare Other | Admitting: Cardiovascular Disease

## 2013-09-12 VITALS — BP 147/86 | HR 73 | Ht 63.0 in | Wt 149.9 lb

## 2013-09-12 DIAGNOSIS — E785 Hyperlipidemia, unspecified: Secondary | ICD-10-CM

## 2013-09-12 DIAGNOSIS — I350 Nonrheumatic aortic (valve) stenosis: Secondary | ICD-10-CM | POA: Insufficient documentation

## 2013-09-12 DIAGNOSIS — I359 Nonrheumatic aortic valve disorder, unspecified: Secondary | ICD-10-CM

## 2013-09-12 DIAGNOSIS — I10 Essential (primary) hypertension: Secondary | ICD-10-CM

## 2013-09-12 DIAGNOSIS — R9431 Abnormal electrocardiogram [ECG] [EKG]: Secondary | ICD-10-CM

## 2013-09-12 DIAGNOSIS — R0789 Other chest pain: Secondary | ICD-10-CM

## 2013-09-12 NOTE — Assessment & Plan Note (Signed)
Controlled on current medications 

## 2013-09-12 NOTE — Assessment & Plan Note (Signed)
On statin therapy followed by her PCP 

## 2013-09-12 NOTE — Patient Instructions (Signed)
Your physician has requested that you have an echocardiogram. Echocardiography is a painless test that uses sound waves to create images of your heart. It provides your doctor with information about the size and shape of your heart and how well your heart's chambers and valves are working. This procedure takes approximately one hour. There are no restrictions for this procedure.  Your physician recommends that you schedule a follow-up appointment in: 6 months.  

## 2013-09-12 NOTE — Assessment & Plan Note (Addendum)
History of aortic stenosis. She does have a soft outflow tract murmur. I am going to check a 2-D echocardiogram

## 2013-09-12 NOTE — Assessment & Plan Note (Signed)
She did have unifocal PVCs on her EKG in Dr. Lanell MatarAronson's office

## 2013-09-12 NOTE — Assessment & Plan Note (Signed)
50 pack years of tobacco abuse having quit 10 years ago. She does have chronic shortness of breath

## 2013-09-12 NOTE — Progress Notes (Signed)
09/12/2013 MARNITA POIRIER   [birth year]  [id number]  Primary Physician Minda Meo, MD Primary Cardiologist: Runell Gess MD [name]   HPI:  Ms. Camillo Flaming is an 80 year old married Caucasian female mother of 3 children whose husband is retired Therapist, sports. She is referred by Dr. Jacky Kindle for cardiovascular evaluation because of PVCs noted on a recent electrocardiogram and a newly auscultated murmur. She has been taken care of by Dr. Melburn Popper her in the past. She has a history of coronary artery disease status post myocardial infarction 8 years ago and intervention [facility] with a stent. She's never had a stroke. She denies chest pain but has chronic shortness of breath from COPD. Her cardiac risk factors include hypertension and hyperlipidemia as well as remote tobacco abuse. Is a history of aortic stenosis there is no recent echo. Her exam is consistent with aortic stenosis. She does complain of left scapular pain which I suspect is referred from cervical radiculopathy.   Current Outpatient Prescriptions  Medication Sig Dispense Refill  . amLODipine (NORVASC) 5 MG tablet Take 5 mg by mouth daily.        Marland Kitchen aspirin 325 MG tablet Take 325 mg by mouth daily.        . calcium citrate-vitamin D (CITRACAL+D) 315-200 MG-UNIT per tablet Take 1 tablet by mouth daily.        . Cholecalciferol (VITAMIN D PO) Take 1 tablet by mouth daily.        . Multiple Vitamins-Minerals (ICAPS PO) Take 1 capsule by mouth 2 (two) times daily.       . nebivolol (BYSTOLIC) 5 MG tablet Take 10 mg by mouth daily.        . simvastatin (ZOCOR) 10 MG tablet Take 10 mg by mouth at bedtime.      Marland Kitchen tiotropium (SPIRIVA) 18 MCG inhalation capsule Place 18 mcg into inhaler and inhale as needed.       . valsartan (DIOVAN) 320 MG tablet Take 320 mg by mouth daily.         No current facility-administered medications for this visit.    Allergies  Allergen Reactions  . Promethazine Hcl Other (See  Comments)    Drop in Blood pressure    History   Social History  . Marital Status: Married    Spouse Name: N/A    Number of Children: N/A  . Years of Education: N/A   Occupational History  . Not on file.   Social History Main Topics  . Smoking status: Former Smoker    Quit date: 02/22/2006  . Smokeless tobacco: Never Used  . Alcohol Use: No  . Drug Use: Not on file  . Sexual Activity: Not on file   Other Topics Concern  . Not on file   Social History Narrative  . No narrative on file     Review of Systems: General: negative for chills, fever, night sweats or weight changes.  Cardiovascular: negative for chest pain, dyspnea on exertion, edema, orthopnea, palpitations, paroxysmal nocturnal dyspnea or shortness of breath Dermatological: negative for rash Respiratory: negative for cough or wheezing Urologic: negative for hematuria Abdominal: negative for nausea, vomiting, diarrhea, bright red blood per rectum, melena, or hematemesis Neurologic: negative for visual changes, syncope, or dizziness All other systems reviewed and are otherwise negative except as noted above.    Blood pressure 147/86, pulse 73, height 5\' 3"  (1.6 m), weight 149 lb 14.4 oz (67.994 kg).  General appearance: alert and no distress  Neck: no adenopathy, no carotid bruit, no JVD, supple, symmetrical, trachea midline and thyroid not enlarged, symmetric, no tenderness/mass/nodules Lungs: clear to auscultation bilaterally Heart: soft outflow tract murmur consistent with aortic stenosis Extremities: extremities normal, atraumatic, no cyanosis or edema and 2+ pedal pulses bilaterally  EKG normal sinus rhythm at 73 with septal Q waves  ASSESSMENT AND PLAN:   Aortic stenosis History of aortic stenosis. She does have a soft outflow tract murmur. I am going to check a 2-D echocardiogram  Hyperlipidemia On statin therapy followed by her PCP  Essential hypertension Controlled on current  medications  COPD (chronic obstructive pulmonary disease) 50 pack years of tobacco abuse having quit 10 years ago. She does have chronic shortness of breath  Abnormal EKG She did have unifocal PVCs on her EKG in Dr. Lanell MatarAronson's office      Runell GessJonathan J. Rockelle Heuerman MD [facility] NovamedFACP,FACC,FAHA, [facility] FranciscoFSCAI 09/12/2013 2:56 PM

## 2013-09-13 ENCOUNTER — Encounter: Payer: Self-pay | Admitting: Cardiovascular Disease

## 2013-09-13 ENCOUNTER — Ambulatory Visit: Payer: Medicare Other | Admitting: Physician Assistant

## 2013-09-17 ENCOUNTER — Other Ambulatory Visit: Payer: Self-pay | Admitting: Internal Medicine

## 2013-09-17 DIAGNOSIS — M549 Dorsalgia, unspecified: Secondary | ICD-10-CM

## 2013-09-19 ENCOUNTER — Ambulatory Visit
Admission: RE | Admit: 2013-09-19 | Discharge: 2013-09-19 | Disposition: A | Discharge status: Home / Self Care | Payer: Medicare Other | Source: Ambulatory Visit | Attending: Internal Medicine | Admitting: Internal Medicine

## 2013-09-19 DIAGNOSIS — M549 Dorsalgia, unspecified: Secondary | ICD-10-CM

## 2013-09-20 ENCOUNTER — Ambulatory Visit (HOSPITAL_COMMUNITY)
Admission: RE | Admit: 2013-09-20 | Discharge: 2013-09-20 | Disposition: A | Discharge status: Home / Self Care | Payer: Medicare Other | Source: Ambulatory Visit | Attending: Cardiology | Admitting: Cardiology

## 2013-09-20 DIAGNOSIS — I359 Nonrheumatic aortic valve disorder, unspecified: Secondary | ICD-10-CM | POA: Diagnosis present

## 2013-09-20 DIAGNOSIS — R0789 Other chest pain: Secondary | ICD-10-CM | POA: Diagnosis not present

## 2013-09-20 DIAGNOSIS — I350 Nonrheumatic aortic (valve) stenosis: Secondary | ICD-10-CM

## 2013-09-20 DIAGNOSIS — R9431 Abnormal electrocardiogram [ECG] [EKG]: Secondary | ICD-10-CM

## 2013-09-20 NOTE — Progress Notes (Signed)
2D Echocardiogram Complete.  09/20/2013   Kalyan Barabas, RDCS   

## 2013-09-24 ENCOUNTER — Other Ambulatory Visit (HOSPITAL_COMMUNITY): Payer: Self-pay | Admitting: Internal Medicine

## 2013-09-24 DIAGNOSIS — M549 Dorsalgia, unspecified: Secondary | ICD-10-CM

## 2013-09-28 ENCOUNTER — Encounter (HOSPITAL_COMMUNITY)
Admission: RE | Admit: 2013-09-28 | Discharge: 2013-09-28 | Disposition: A | Discharge status: Home / Self Care | Payer: Medicare Other | Source: Ambulatory Visit | Attending: Internal Medicine | Admitting: Internal Medicine

## 2013-09-28 ENCOUNTER — Ambulatory Visit (HOSPITAL_COMMUNITY)
Admission: RE | Admit: 2013-09-28 | Discharge: 2013-09-28 | Disposition: A | Discharge status: Home / Self Care | Payer: Medicare Other | Source: Ambulatory Visit | Attending: Diagnostic Radiology | Admitting: Diagnostic Radiology

## 2013-09-28 DIAGNOSIS — M549 Dorsalgia, unspecified: Secondary | ICD-10-CM | POA: Diagnosis present

## 2013-09-28 DIAGNOSIS — M479 Spondylosis, unspecified: Secondary | ICD-10-CM | POA: Diagnosis not present

## 2013-09-28 MED ORDER — TECHNETIUM TC 99M MEDRONATE IV KIT
23.0000 | PACK | Freq: Once | INTRAVENOUS | Status: AC | PRN
  Administered 2013-09-28: 23 via INTRAVENOUS

## 2014-03-19 ENCOUNTER — Ambulatory Visit: Payer: Medicare Other | Admitting: Cardiovascular Disease

## 2014-03-27 ENCOUNTER — Encounter: Payer: Self-pay | Admitting: Cardiovascular Disease

## 2014-04-08 ENCOUNTER — Encounter: Payer: Self-pay | Admitting: *Deleted

## 2014-04-30 ENCOUNTER — Encounter: Payer: Self-pay | Admitting: Cardiovascular Disease

## 2014-04-30 ENCOUNTER — Ambulatory Visit (INDEPENDENT_AMBULATORY_CARE_PROVIDER_SITE_OTHER): Payer: Medicare Other | Admitting: Cardiovascular Disease

## 2014-04-30 VITALS — BP 138/86 | HR 76 | Ht 64.0 in | Wt 145.1 lb

## 2014-04-30 DIAGNOSIS — R9431 Abnormal electrocardiogram [ECG] [EKG]: Secondary | ICD-10-CM

## 2014-04-30 DIAGNOSIS — I350 Nonrheumatic aortic (valve) stenosis: Secondary | ICD-10-CM | POA: Diagnosis not present

## 2014-04-30 DIAGNOSIS — I2583 Coronary atherosclerosis due to lipid rich plaque: Secondary | ICD-10-CM

## 2014-04-30 DIAGNOSIS — E785 Hyperlipidemia, unspecified: Secondary | ICD-10-CM | POA: Diagnosis not present

## 2014-04-30 DIAGNOSIS — I10 Essential (primary) hypertension: Secondary | ICD-10-CM | POA: Diagnosis not present

## 2014-04-30 DIAGNOSIS — I2510 Atherosclerotic heart disease of native coronary artery without angina pectoris: Secondary | ICD-10-CM | POA: Diagnosis not present

## 2014-04-30 NOTE — Assessment & Plan Note (Addendum)
History of mild aortic stenosis with recent 2-D echo performed in July of last year revealing normal LV systolic function with an aortic valve area of 1.7 cm squared a mean gradient of 8 mmHg. He does have a soft outflow tract murmur

## 2014-04-30 NOTE — Progress Notes (Signed)
04/30/2014 Heidi Morales   02-15-1931  [id number]  Primary Physician ARONSON,RICHARD A, MD Primary Cardiologist: Runell Gess MD [name]   HPI:  : Ms. Heidi Morales is an 81 year old married Caucasian female mother of 3 children whose husband is retired Therapist, sports. I last saw her in the office 09/12/13. She was referred by Dr. Jacky Morales for cardiovascular evaluation because of PVCs noted on a recent electrocardiogram and a newly auscultated murmur. She has been taken care of by Dr. Melburn Popper her in the past. She has a history of coronary artery disease status post myocardial infarction 8 years ago and intervention [facility] with a stent. She's never had a stroke. She denies chest pain but has chronic shortness of breath from COPD. Her cardiac risk factors include hypertension and hyperlipidemia as well as remote tobacco abuse. She had a 2-D echo performed 09/20/13 revealing normal LV systolic function with mild aortic stenosis. She did complain of left scapular pain which I suspect is referred from cervical radiculopathy. This resolved with conservative measures including a heating pad and Motrin.   Current Outpatient Prescriptions  Medication Sig Dispense Refill  . amLODipine (NORVASC) 5 MG tablet Take 5 mg by mouth daily.      Marland Kitchen aspirin 325 MG tablet Take 325 mg by mouth daily.      Marland Kitchen BYSTOLIC 10 MG tablet Take 1 tablet by mouth daily.  11  . calcium citrate-vitamin D (CITRACAL+D) 315-200 MG-UNIT per tablet Take 1 tablet by mouth daily.      . Cholecalciferol (VITAMIN D PO) Take 1 tablet by mouth daily.      . hydrochlorothiazide (MICROZIDE) 12.5 MG capsule Take 1 capsule by mouth daily.  3  . Multiple Vitamins-Minerals (ICAPS PO) Take 1 capsule by mouth 2 (two) times daily.     . simvastatin (ZOCOR) 10 MG tablet Take 10 mg by mouth at bedtime.    . SYMBICORT 160-4.5 MCG/ACT inhaler Place 1 puff into the nose as needed.  5  . tiotropium (SPIRIVA) 18 MCG inhalation capsule  Place 18 mcg into inhaler and inhale as needed.     . valsartan (DIOVAN) 320 MG tablet Take 320 mg by mouth daily.       No current facility-administered medications for this visit.    Allergies  Allergen Reactions  . Promethazine Hcl Other (See Comments)    Drop in Blood pressure    History   Social History  . Marital Status: Married    Spouse Name: N/A  . Number of Children: N/A  . Years of Education: N/A   Occupational History  . Not on file.   Social History Main Topics  . Smoking status: Former Smoker    Quit date: 02/22/2006  . Smokeless tobacco: Never Used  . Alcohol Use: No  . Drug Use: Not on file  . Sexual Activity: Not on file   Other Topics Concern  . Not on file   Social History Narrative     Review of Systems: General: negative for chills, fever, night sweats or weight changes.  Cardiovascular: negative for chest pain, dyspnea on exertion, edema, orthopnea, palpitations, paroxysmal nocturnal dyspnea or shortness of breath Dermatological: negative for rash Respiratory: negative for cough or wheezing Urologic: negative for hematuria Abdominal: negative for nausea, vomiting, diarrhea, bright red blood per rectum, melena, or hematemesis Neurologic: negative for visual changes, syncope, or dizziness All other systems reviewed and are otherwise negative except as noted above.    Blood pressure 138/86,  pulse 76, height 5\' 4"  (1.626 m), weight 145 lb 1.6 oz (65.817 kg).  General appearance: alert and no distress Neck: no adenopathy, no carotid bruit, no JVD, supple, symmetrical, trachea midline and thyroid not enlarged, symmetric, no tenderness/mass/nodules Lungs: clear to auscultation bilaterally Heart: soft outflow tract murmur consistent with aortic stenosis Extremities: extremities normal, atraumatic, no cyanosis or edema  EKG normal sinus rhythm at 64 with frequent PVCs. I personally reviewed this EKG  ASSESSMENT AND PLAN:    Hyperlipidemia History of hyperlipidemia on simvastatin 10 mg followed by her PCP   Essential hypertension History of hypertension blood pressure measured 130/86. She is on amlodipine, Bystolic and hydrochlorothiazide. Continue current meds at current dosing   CAD (coronary artery disease) History of coronary artery disease status post myocardial infarction 8 years ago with intervention [facility]. She denies chest pain.   Aortic stenosis History of mild aortic stenosis with recent 2-D echo performed in July of last year revealing normal LV systolic function with an aortic valve area of 1.7 cm squared a mean gradient of 8 mmHg. He does have a soft outflow tract murmur       Runell GessJonathan J. Sabrina Arriaga MD [facility] AdolescentsFACP,FACC,FAHA, [facility]SC[facility] 04/30/2014 5:12 PM

## 2014-04-30 NOTE — Assessment & Plan Note (Signed)
History of hypertension blood pressure measured 130/86. She is on amlodipine, Bystolic and hydrochlorothiazide. Continue current meds at current dosing

## 2014-04-30 NOTE — Patient Instructions (Signed)
We request that you follow-up in: 6 months with an extender and in 1 year with Dr Berry  You will receive a reminder letter in the mail two months in advance. If you don't receive a letter, please call our office to schedule the follow-up appointment.   

## 2014-04-30 NOTE — Assessment & Plan Note (Signed)
History of coronary artery disease status post myocardial infarction 8 years ago with intervention at [facility]. She denies chest pain.

## 2014-04-30 NOTE — Assessment & Plan Note (Signed)
History of hyperlipidemia on simvastatin 10 mg followed by her PCP

## 2014-09-20 ENCOUNTER — Encounter: Payer: Self-pay | Admitting: Cardiovascular Disease

## 2014-09-20 ENCOUNTER — Ambulatory Visit (INDEPENDENT_AMBULATORY_CARE_PROVIDER_SITE_OTHER): Payer: Medicare Other | Admitting: Cardiovascular Disease

## 2014-09-20 VITALS — BP 144/80 | HR 78 | Ht 64.0 in | Wt 140.8 lb

## 2014-09-20 DIAGNOSIS — E785 Hyperlipidemia, unspecified: Secondary | ICD-10-CM | POA: Diagnosis not present

## 2014-09-20 DIAGNOSIS — I350 Nonrheumatic aortic (valve) stenosis: Secondary | ICD-10-CM

## 2014-09-20 DIAGNOSIS — I10 Essential (primary) hypertension: Secondary | ICD-10-CM

## 2014-09-20 DIAGNOSIS — I2510 Atherosclerotic heart disease of native coronary artery without angina pectoris: Secondary | ICD-10-CM | POA: Diagnosis not present

## 2014-09-20 DIAGNOSIS — I2583 Coronary atherosclerosis due to lipid rich plaque: Secondary | ICD-10-CM

## 2014-09-20 NOTE — Assessment & Plan Note (Signed)
History of hypertension with blood pressure measured at 144/80. She is on amlodipine, valsartan and hydrochlorothiazide as well as Bystolic c. Continue current meds at current dosing

## 2014-09-20 NOTE — Assessment & Plan Note (Signed)
History of hyperlipidemia. The patient has stopped her Zocor on her own. We will recheck a lipid and liver profile

## 2014-09-20 NOTE — Patient Instructions (Signed)
Your physician recommends that you return for lab work at your earliest convenience - FASTING.  Dr Berry recommends that you schedule a follow-up appointment in 1 year. You will receive a reminder letter in the mail two months in advance. If you don't receive a letter, please call our office to schedule the follow-up appointment. 

## 2014-09-20 NOTE — Progress Notes (Signed)
09/20/2014 Heidi Morales   1930/10/15  [id number]  Primary Physician ARONSON,RICHARD A, MD Primary Cardiologist: Runell Gess MD [name]   HPI:  Ms. Camillo Flaming is an 81 year old married Caucasian female mother of 3 children whose husband is retired Therapist, sports. I last saw her in the office 04/30/14.  She was referred by Dr. Jacky Kindle for cardiovascular evaluation because of PVCs noted on a recent electrocardiogram and a newly auscultated murmur. She has been taken care of by Dr. Melburn Popper her in the past. She has a history of coronary artery disease status post myocardial infarction 8 years ago and intervention [facility] with a stent. She's never had a stroke. She denies chest pain but has chronic shortness of breath from COPD. Her cardiac risk factors include hypertension and hyperlipidemia as well as remote tobacco abuse. She had a 2-D echo performed 09/20/13 revealing normal LV systolic function with mild aortic stenosis. She did complain of left scapular pain which I suspect is referred from cervical radiculopathy. This resolved with conservative measures including a heating pad and Motrin. Since I saw her 4 months ago she's remained clinically stable and is asymptomatic.   Current Outpatient Prescriptions  Medication Sig Dispense Refill  . amLODipine (NORVASC) 5 MG tablet Take 5 mg by mouth daily.      Marland Kitchen aspirin 325 MG tablet Take 325 mg by mouth daily.      Marland Kitchen BYSTOLIC 10 MG tablet Take 1 tablet by mouth daily.  11  . calcium citrate-vitamin D (CITRACAL+D) 315-200 MG-UNIT per tablet Take 1 tablet by mouth daily.      . Cholecalciferol (VITAMIN D PO) Take 1 tablet by mouth daily.      . hydrochlorothiazide (HYDRODIURIL) 25 MG tablet Take 25 mg by mouth daily.    . Multiple Vitamins-Minerals (ICAPS PO) Take 1 capsule by mouth 2 (two) times daily.     Marland Kitchen tiotropium (SPIRIVA) 18 MCG inhalation capsule Place 18 mcg into inhaler and inhale as needed.     . valsartan (DIOVAN)  320 MG tablet Take 320 mg by mouth daily.       No current facility-administered medications for this visit.    Allergies  Allergen Reactions  . Promethazine Hcl Other (See Comments)    Drop in Blood pressure    History   Social History  . Marital Status: Married    Spouse Name: N/A  . Number of Children: N/A  . Years of Education: N/A   Occupational History  . Not on file.   Social History Main Topics  . Smoking status: Former Smoker    Quit date: 02/22/2006  . Smokeless tobacco: Never Used  . Alcohol Use: No  . Drug Use: Not on file  . Sexual Activity: Not on file   Other Topics Concern  . Not on file   Social History Narrative     Review of Systems: General: negative for chills, fever, night sweats or weight changes.  Cardiovascular: negative for chest pain, dyspnea on exertion, edema, orthopnea, palpitations, paroxysmal nocturnal dyspnea or shortness of breath Dermatological: negative for rash Respiratory: negative for cough or wheezing Urologic: negative for hematuria Abdominal: negative for nausea, vomiting, diarrhea, bright red blood per rectum, melena, or hematemesis Neurologic: negative for visual changes, syncope, or dizziness All other systems reviewed and are otherwise negative except as noted above.    Blood pressure 144/80, pulse 78, height  (1.626 m), weight 140 lb 12.8 oz (63.866 kg).  General appearance: alert  and no distress Neck: no adenopathy, no carotid bruit, no JVD, supple, symmetrical, trachea midline and thyroid not enlarged, symmetric, no tenderness/mass/nodules Lungs: clear to auscultation bilaterally Heart: regular rate and rhythm, S1, S2 normal, no murmur, click, rub or gallop Extremities: extremities normal, atraumatic, no cyanosis or edema  EKG /78 with trigeminal PVCs and septal Q waves. I personally reviewed this EKG  ASSESSMENT AND PLAN:   Hyperlipidemia History of hyperlipidemia. The patient has stopped her Zocor on  her own. We will recheck a lipid and liver profile  Essential hypertension History of hypertension with blood pressure measured at 144/80. She is on amlodipine, valsartan and hydrochlorothiazide as well as Bystolic c. Continue current meds at current dosing  CAD (coronary artery disease) History of CAD status post myocardial infarction in 9 years ago with intervention having to Hospital with stent placement. She denies chest pain or shortness of breath.  Aortic stenosis History of mild aortic stenosis with echo performed 09/20/13 revealing normal LV function, aortic valve area 1.7 cm2 with a peak gradient of 15 mmHg.      Runell Gess MD FACP,FACC,FAHA, [facility] 09/20/2014 10:27 AM

## 2014-09-20 NOTE — Assessment & Plan Note (Signed)
History of mild aortic stenosis with echo performed 09/20/13 revealing normal LV function, aortic valve area 1.7 cm2 with a peak gradient of 15 mmHg.

## 2014-09-20 NOTE — Assessment & Plan Note (Signed)
History of CAD status post myocardial infarction in 9 years ago with intervention having to Hospital with stent placement. She denies chest pain or shortness of breath.

## 2014-10-08 LAB — LIPID PANEL
CHOL/HDL RATIO: 3.4 ratio (ref <=5.0)
CHOLESTEROL: 181 mg/dL (ref 125–200)
HDL: 53 mg/dL (ref >=46)
LDL Cholesterol: 99 mg/dL (ref <130)
TRIGLYCERIDES: 143 mg/dL (ref <150)
VLDL: 29 mg/dL (ref <30)

## 2014-10-08 LAB — HEPATIC FUNCTION PANEL
ALBUMIN: 3.9 g/dL (ref 3.6–5.1)
ALT: 10 U/L (ref 6–29)
AST: 13 U/L (ref 10–35)
Alkaline Phosphatase: 97 U/L (ref 33–130)
BILIRUBIN DIRECT: 0.1 mg/dL (ref <=0.2)
Indirect Bilirubin: 0.5 mg/dL (ref 0.2–1.2)
Total Bilirubin: 0.6 mg/dL (ref 0.2–1.2)
Total Protein: 6.6 g/dL (ref 6.1–8.1)

## 2014-10-09 ENCOUNTER — Telehealth: Payer: Self-pay | Admitting: Cardiovascular Disease

## 2014-10-09 DIAGNOSIS — Z79899 Other long term (current) drug therapy: Secondary | ICD-10-CM

## 2014-10-09 NOTE — Telephone Encounter (Signed)
Returning call,concerning her lab results from Monday.Please call back around 1:30 please.

## 2014-10-09 NOTE — Telephone Encounter (Signed)
Patient/husband notified of lab results and explained need to resume statin and recheck labs. They voiced understanding. Simvastatin  added back on med list. Labs ordered and lab slips mailed to patient.

## 2015-01-13 ENCOUNTER — Ambulatory Visit (INDEPENDENT_AMBULATORY_CARE_PROVIDER_SITE_OTHER)
Admission: RE | Admit: 2015-01-13 | Discharge: 2015-01-13 | Disposition: A | Discharge status: Home / Self Care | Payer: Medicare Other | Source: Ambulatory Visit | Attending: Internal Medicine | Admitting: Internal Medicine

## 2015-01-13 ENCOUNTER — Ambulatory Visit (INDEPENDENT_AMBULATORY_CARE_PROVIDER_SITE_OTHER): Payer: Medicare Other | Admitting: Internal Medicine

## 2015-01-13 ENCOUNTER — Encounter: Payer: Self-pay | Admitting: Internal Medicine

## 2015-01-13 ENCOUNTER — Telehealth: Payer: Self-pay | Admitting: Internal Medicine

## 2015-01-13 ENCOUNTER — Other Ambulatory Visit (INDEPENDENT_AMBULATORY_CARE_PROVIDER_SITE_OTHER): Payer: Medicare Other

## 2015-01-13 VITALS — BP 106/70 | HR 84 | Ht 64.0 in | Wt 142.0 lb

## 2015-01-13 DIAGNOSIS — I2510 Atherosclerotic heart disease of native coronary artery without angina pectoris: Secondary | ICD-10-CM

## 2015-01-13 DIAGNOSIS — R0600 Dyspnea, unspecified: Secondary | ICD-10-CM

## 2015-01-13 DIAGNOSIS — J9611 Chronic respiratory failure with hypoxia: Secondary | ICD-10-CM | POA: Diagnosis not present

## 2015-01-13 DIAGNOSIS — I10 Essential (primary) hypertension: Secondary | ICD-10-CM

## 2015-01-13 DIAGNOSIS — J9621 Acute and chronic respiratory failure with hypoxia: Secondary | ICD-10-CM | POA: Insufficient documentation

## 2015-01-13 DIAGNOSIS — J449 Chronic obstructive pulmonary disease, unspecified: Secondary | ICD-10-CM

## 2015-01-13 LAB — BRAIN NATRIURETIC PEPTIDE: Pro B Natriuretic peptide (BNP): 327 pg/mL — ABNORMAL HIGH (ref 0.0–100.0)

## 2015-01-13 MED ORDER — TIOTROPIUM BROMIDE-OLODATEROL 2.5-2.5 MCG/ACT IN AERS: 2.0000 | INHALATION_SPRAY | Freq: Every day | RESPIRATORY_TRACT | Status: AC

## 2015-01-13 NOTE — Telephone Encounter (Signed)
lmtcb X1 for Heidi Morales (Dr. Shawnie PonsAronsen's assistant)

## 2015-01-13 NOTE — Telephone Encounter (Signed)
[phone], Cicero DuckErika cb

## 2015-01-13 NOTE — Progress Notes (Signed)
Subjective:    Patient ID: Heidi Morales, female    DOB: 12/02/1930,    MRN: [id number]  HPI  [id number] yowf married to Dr Bunnie PionBodner a retired psychiatrist s/p smoking cessation 2008 with onset of ihd/ s/p stent and resumed nl activity including yard work, steps s any resp meds  and did fine until around 2010 dx of emphysema by CT and then noted  sob in 2012 gradually worse so referred by Dr Jacky KindleAronson 01/13/2015 to pulmonary clinic    01/13/2015 1st [facility] office visit/ Axie Hayne  maint rx spiriva  Chief Complaint  Patient presents with  . Pulmonary Consult    Referred by Dr. Jacky KindleAronson.  Pt states she has had breathing problems for the past 2 yrs. She c/o increased DOE for the past month.  She has also had non prod cough. She was out of breath walking from lobby to exam room today, and sats on RA at rest were 63%--increased to    on 02 x 2 years  But doesn't use portable system at all.  Gradual onset progressive to 50 ft flat and slow assoc with dry cough day > noct  No obvious other patterns in day to day or daytime variabilty or assoc cp or chest tightness, subjective wheeze overt sinus or hb symptoms. No unusual exp hx or h/o childhood pna/ asthma or knowledge of premature birth.  Sleeping ok without nocturnal  or early am exacerbation  of respiratory  c/o's or need for noct saba. Also denies any obvious fluctuation of symptoms with weather or environmental changes or other aggravating or alleviating factors except as outlined above   Current Medications, Allergies, Complete Past Medical History, Past Surgical History, Family History, and Social History were reviewed in [facility] electronic [facility] record.            Review of Systems  Constitutional: Negative for fever, chills and unexpected weight change.  HENT: Negative for congestion, dental problem, ear pain, nosebleeds, postnasal drip, rhinorrhea, sinus pressure, sneezing, sore throat, trouble swallowing and voice change.   Eyes:  Negative for visual disturbance.  Respiratory: Positive for cough and shortness of breath. Negative for choking.   Cardiovascular: Negative for chest pain and leg swelling.  Gastrointestinal: Negative for vomiting, abdominal pain and diarrhea.  Genitourinary: Negative for difficulty urinating.       Indigestion  Musculoskeletal: Negative for arthralgias.  Skin: Negative for rash.  Neurological: Negative for tremors, syncope and headaches.  Hematological: Does not bruise/bleed easily.       Objective:   Physical Exam   amb frail elderly wf nad  Wt Readings from Last 3 Encounters:  01/13/15 142 lb (64.411 kg)  09/20/14 140 lb 12.8 oz (63.866 kg)  04/30/14 145 lb 1.6 oz (65.817 kg)    Vital signs reviewed  HEENT: nl dentition, turbinates, and oropharynx. Nl external ear canals without cough reflex   NECK :  without JVD/Nodes/TM/ nl carotid upstrokes bilaterally   LUNGS: no acc muscle use, slt hyperresonant to percussion/ distant bs  bilaterally without cough on insp or exp maneuvers   CV:  RRR  no s3 or murmur or increase in P2, no edema   ABD:  soft and nontender with end insp hoover's sign. No bruits or organomegaly, bowel sounds nl  MS:  warm without deformities, calf tenderness, cyanosis or clubbing  SKIN: warm and dry without lesions    NEURO:  alert, approp, no deficits      CXR PA and Lateral:  01/13/2015 :    I personally reviewed images and agree with radiology impression as follows:   COPD with no acute abnormalities.    Labs ordered/ reviewed:     Chemistry      Component Value Date/Time   NA 139 01/13/2015 1716   K 4.1 01/13/2015 1716   CL 100 01/13/2015 1716   CO2 25 01/13/2015 1716   BUN 21 01/13/2015 1716   CREATININE 1.02 01/13/2015 1716      Component Value Date/Time   CALCIUM 10.4 01/13/2015 1716   ALKPHOS 97 10/07/2014 0928   AST 13 10/07/2014 0928   ALT 10 10/07/2014 0928   BILITOT 0.6 10/07/2014 0928        Lab Results    Component Value Date   WBC 10.3 01/13/2015   HGB 16.9* 01/13/2015   HCT 50.1* 01/13/2015   MCV 105.0* 01/13/2015   PLT 312.0 01/13/2015         Lab Results  Component Value Date   TSH 2.24 01/13/2015     Lab Results  Component Value Date   PROBNP 327.0* 01/13/2015              Assessment & Plan:

## 2015-01-13 NOTE — Patient Instructions (Addendum)
Stop spiriva and start stiolto 2 puffs each am   Wear 02 3lpm at rest and 6lpm with activity for now  Please see patient coordinator before you leave today  to schedule portable 02 titration  Please remember to go to the  Lab and x-ray department downstairs for your tests - we will call you with the results when they are available.    Please schedule a follow up office visit in 6 weeks, call sooner if needed with pfts

## 2015-01-13 NOTE — Telephone Encounter (Signed)
Dr wert has a 15.45 open 01/13/2015. IF that wont work then next week or go to ER. I do not have other slots this week

## 2015-01-13 NOTE — Telephone Encounter (Signed)
Spoke with Dr. Shawnie PonsAronsen's assistant Cicero DuckErika- states pt has COPD, on 02 2lpm 24/7 at home.  02 started by Dr. Tawni LevyAronsen.  Pt's husband is a retired Development worker, communityphysician, has been closely monitoring pt's 02 sats.  Pt and husband called Dr. Shawnie PonsAronsen's office today, stating that pt's 02 is down to 60's% at rest.  Pt denies any increased sob, chest tightness, etc.  Dr. Tawni LevyAronsen is requesting that pt be worked in by our office this week if possible.  Pt has not been seen by pulmonary before.  I advised Cicero Duckrika that if pt's sats are that low she should go to the ED.  Cicero Duckrika states that because pt is asymptomatic Dr. Tawni LevyAronsen feels that she can wait for an OV.   There are no open appointments this week-holiday week with limited staff to schedule with.    Sending to Doc of the Day to advise on how to handle this message-can pt be worked in with a provider, or should she go to ED and be rounded on by pulmonary in hospital?  Thanks!

## 2015-01-13 NOTE — Telephone Encounter (Signed)
Called and spoke with pt's spouse  Spouse was informed of opening with MW per MR or he could take pt to ER for evaluation Spouse stated that he was a retired physician and that his wife is not symptomatic right now so he does not feel that this matter is of urgency that validates an ER visit Spouse stated he would bring his wife to office to be evaluated by MW Instructions were given to the office  Spouse voiced understanding  Dr Graceann CongressAronsens's office called and was told of appt and i was told ov notes would be sent  Nothing further is needed

## 2015-01-14 ENCOUNTER — Encounter: Payer: Self-pay | Admitting: Internal Medicine

## 2015-01-14 LAB — BASIC METABOLIC PANEL
BUN: 21 mg/dL (ref 6–23)
CALCIUM: 10.4 mg/dL (ref 8.4–10.5)
CHLORIDE: 100 meq/L (ref 96–112)
CO2: 25 meq/L (ref 19–32)
Creatinine, Ser: 1.02 mg/dL (ref 0.40–1.20)
GFR: 55.1 mL/min — ABNORMAL LOW (ref >60.00)
Glucose, Bld: 114 mg/dL — ABNORMAL HIGH (ref 70–99)
Potassium: 4.1 mEq/L (ref 3.5–5.1)
SODIUM: 139 meq/L (ref 135–145)

## 2015-01-14 LAB — CBC WITH DIFFERENTIAL/PLATELET
BASOS PCT: 0.8 % (ref 0.0–3.0)
Basophils Absolute: 0.1 10*3/uL (ref 0.0–0.1)
EOS ABS: 0.3 10*3/uL (ref 0.0–0.7)
Eosinophils Relative: 3.3 % (ref 0.0–5.0)
HCT: 50.1 % — ABNORMAL HIGH (ref 36.0–46.0)
HEMOGLOBIN: 16.9 g/dL — AB (ref 12.0–15.0)
Lymphocytes Relative: 20.4 % (ref 12.0–46.0)
Lymphs Abs: 2.1 10*3/uL (ref 0.7–4.0)
MCHC: 33.8 g/dL (ref 30.0–36.0)
MCV: 105 fl — ABNORMAL HIGH (ref 78.0–100.0)
Monocytes Absolute: 0.9 10*3/uL (ref 0.1–1.0)
Monocytes Relative: 9.1 % (ref 3.0–12.0)
Neutro Abs: 6.9 10*3/uL (ref 1.4–7.7)
Neutrophils Relative %: 66.4 % (ref 43.0–77.0)
Platelets: 312 10*3/uL (ref 150.0–400.0)
RBC: 4.77 Mil/uL (ref 3.87–5.11)
RDW: 14.1 % (ref 11.5–15.5)
WBC: 10.3 10*3/uL (ref 4.0–10.5)

## 2015-01-14 LAB — TSH: TSH: 2.24 u[IU]/mL (ref 0.35–4.50)

## 2015-01-14 NOTE — Assessment & Plan Note (Addendum)
Bystolic not an issue for copd but May need to consider an alternative to norvasc which potentially can interfere with v/q matching by causing pulm vasc dilation to poorly aerated segments

## 2015-01-14 NOTE — Assessment & Plan Note (Addendum)
Sats 63% RA at rest 01/13/2015  > 88% on 3lpm   01/13/2015   Walked 6lpm x one lap @ 185 stopped due to desat at 1/2 lap at slow pace   rec as of 01/13/2015 =  3lpm 24/7 and 6lpm with ex pending amb 02 sats titration by dme/ordered

## 2015-01-14 NOTE — Assessment & Plan Note (Addendum)
DDX of  difficult airways management all start with A and  include Adherence, Ace Inhibitors, Acid Reflux, Active Sinus Disease, Alpha 1 Antitripsin deficiency, Anxiety masquerading as Airways dz,  ABPA,  allergy(esp in young), Aspiration (esp in elderly), Adverse effects of meds,  Active smokers, A bunch of PE's (a small clot burden can't cause this syndrome unless there is already severe underlying pulm or vascular dz with poor reserve) plus two Bs  = Bronchiectasis and Beta blocker use..and one C= CHF   Adherence is always the initial "prime suspect" and is a multilayered concern that requires a "trust but verify" approach in every patient - starting with knowing how to use medications, especially inhalers, correctly, keeping up with refills and understanding the fundamental difference between maintenance and prns vs those medications only taken for a very short course and then stopped and not refilled.  - clearly not compliant with 02 and doubt also with meds - The proper method of use, as well as anticipated side effects, of a metered-dose inhaler are discussed and demonstrated to the patient. Improved effectiveness after extensive coaching during this visit to a level of approximately  90% from a baseline of < 50% so try change spiriva to stiolto 2 each am   ? Adverse effect of dpi > can cause a dry mouth > change to respimat on trial basis   ? Allergies/ asthmatic component > not evident by hex   ? chf > bnp intermediate but no other evidence of this   I had an extended discussion with the patient reviewing all relevant studies completed to date and  lasting 35 minutes of a 60 minute visit    Each maintenance medication was reviewed in detail including most importantly the difference between maintenance and prns and under what circumstances the prns are to be triggered using an action plan format that is not reflected in the computer generated alphabetically organized AVS.    Please see  instructions for details which were reviewed in writing and the patient given a copy highlighting the part that I personally wrote and discussed at today's ov.

## 2015-01-15 NOTE — Progress Notes (Signed)
Quick Note:  Called and spoke to pt's husband. Informed him of the results and recs pre MW. Pt's husband verbalized understanding and denied any further questions or concerns at this time.   ______

## 2015-01-17 ENCOUNTER — Encounter: Payer: Self-pay | Admitting: Internal Medicine

## 2015-01-20 NOTE — Assessment & Plan Note (Signed)
C/w copd/ emphysema > see sep a/p

## 2015-01-27 ENCOUNTER — Telehealth: Payer: Self-pay | Admitting: Internal Medicine

## 2015-01-27 DIAGNOSIS — R0600 Dyspnea, unspecified: Secondary | ICD-10-CM

## 2015-01-27 DIAGNOSIS — J9611 Chronic respiratory failure with hypoxia: Secondary | ICD-10-CM

## 2015-01-27 NOTE — Telephone Encounter (Signed)
Order placed for POC evaluation   Pt called and informed of order   Nothing further is needed at this time.

## 2015-01-27 NOTE — Telephone Encounter (Signed)
Spoke with Dr. Bunnie PionBodner (pt's wife), states that she is doing much better since starting Stiolto.   Pt's husband also states that pt is trying to stay as mobile as possible, and would like to get a POC through their DME if possible.  Per last ov note, pt is wearing 3lpm at rest and 6lpm with exertion.  Pt's husband could not confirm if this was correct.  Pt uses [facility].    MW are you ok with ordering a POC evaluation?  Thanks!

## 2015-01-27 NOTE — Telephone Encounter (Signed)
That's fine but I doubt she's qualify if 02 need really is that high

## 2015-02-26 ENCOUNTER — Ambulatory Visit: Payer: BLUE CROSS/BLUE SHIELD | Admitting: Internal Medicine

## 2015-03-04 ENCOUNTER — Ambulatory Visit: Payer: Medicare Other | Admitting: Podiatry

## 2015-03-06 ENCOUNTER — Telehealth: Payer: Self-pay | Admitting: Internal Medicine

## 2015-03-06 NOTE — Telephone Encounter (Signed)
Spoke with pt's husband, pt is already scheduled for ov on 03/13/15 with pft.  Advised that pt needs to bring all meds with her to this visit.  Also advised pt needs to stay on 02 24/7 per last ov note.  Pt's husband expressed understanding.  Nothing further needed at this time.

## 2015-03-06 NOTE — Telephone Encounter (Signed)
Should not be off 02   rec as of 01/13/2015 = 3lpm 24/7 and 6lpm with ex pending amb 02 sats titration by dme/ordered   Was supposed to be back by now so needs ov with all meds in hand asap

## 2015-03-06 NOTE — Telephone Encounter (Signed)
Spoke with patient husband, states that patient is not breathing well - increased SOB  Using 2 Liter O2 and pt wanting to titrate O2 up to 3L based on O2 needs.- with little exertion O2 drops to 60's on RA, but with 2 Liters patient has no desat that is noted Pt c/o feeling "bad" and husband is not sure if this is related to her O2. States that they have not been checking her O2 on 2 liters to see if she is dropping below 88%. States that she does not complain while on O2. I asked why she is not wearing her O2 when she is walking around the house or when they are out and about and the husband stated that the tanks were too heavy, they are still waiting on [facility] to do POC eval. Husband stated [facility] (without O2) and I advised him to be mindful of her O2 as it dipping too low can be harmful and she could potentially pass out if it drops too low. Husband states he is aware of this. Aware that I will send to Dr Sherene SiresWert to advise on O2 titration, I have already explained to the husband that if O2 is NOT dropping on 2 liters then of course 3 liters may not be needed. Aware also that I will contact [facility] to find out why POC eval has yet to be done (order sent Nov 2016 and Dec 2016).  Please advise Dr Sherene SiresWert. Thanks.  ----- Spoke with Larita FifeLynn [facility], states that Selena BattenKim has this order, not sure what is going on and why this has not been done or why there has been delay. Kim to call back this afternoon when Selena BattenKim returns to office.

## 2015-03-11 ENCOUNTER — Telehealth: Payer: Self-pay | Admitting: Internal Medicine

## 2015-03-11 NOTE — Telephone Encounter (Signed)
Spoke with pt's husband, states that they have not heard from [facility] about pt's POC.  States that they have been waiting over a month for POC eval, but [facility] keeps telling them that "someone will contact them in a few days".  Pt's husband says they have an appt with MW on Thursday so there is no rush on the order.  Called [facility] and spoke with Larita Fife, states that the POC has been ordered but has not yet been shipped to [facility].  States that it can take up to 2 mos to have POC be ordered and shipped.  Larita Fife states she will call pt and husband to make aware of this.  Nothing further needed at this time.

## 2015-03-13 ENCOUNTER — Ambulatory Visit (INDEPENDENT_AMBULATORY_CARE_PROVIDER_SITE_OTHER): Payer: Medicare Other | Admitting: Internal Medicine

## 2015-03-13 ENCOUNTER — Encounter: Payer: Self-pay | Admitting: Internal Medicine

## 2015-03-13 VITALS — BP 94/60 | HR 75 | Ht 64.0 in | Wt 139.0 lb

## 2015-03-13 DIAGNOSIS — J9611 Chronic respiratory failure with hypoxia: Secondary | ICD-10-CM

## 2015-03-13 DIAGNOSIS — J449 Chronic obstructive pulmonary disease, unspecified: Secondary | ICD-10-CM

## 2015-03-13 LAB — PULMONARY FUNCTION TEST
DL/VA % pred: 22 %
DL/VA: 1.07 ml/min/mmHg/L
DLCO UNC % PRED: 21 %
DLCO UNC: 5.28 ml/min/mmHg
FEF 25-75 POST: 0.79 L/s
FEF 25-75 PRE: 0.79 L/s
FEF2575-%CHANGE-POST: 0 %
FEF2575-%PRED-POST: 61 %
FEF2575-%Pred-Pre: 61 %
FEV1-%CHANGE-POST: 0 %
FEV1-%PRED-PRE: 92 %
FEV1-%Pred-Post: 91 %
FEV1-POST: 1.7 L
FEV1-Pre: 1.7 L
FEV1FVC-%CHANGE-POST: 0 %
FEV1FVC-%PRED-PRE: 83 %
FEV6-%Change-Post: 0 %
FEV6-%Pred-Post: 115 %
FEV6-%Pred-Pre: 115 %
FEV6-Post: 2.74 L
FEV6-Pre: 2.71 L
FEV6FVC-%Change-Post: 0 %
FEV6FVC-%Pred-Post: 103 %
FEV6FVC-%Pred-Pre: 102 %
FVC-%Change-Post: 0 %
FVC-%PRED-POST: 112 %
FVC-%Pred-Pre: 112 %
FVC-Post: 2.82 L
FVC-Pre: 2.81 L
POST FEV1/FVC RATIO: 60 %
PRE FEV6/FVC RATIO: 97 %
Post FEV6/FVC ratio: 97 %
Pre FEV1/FVC ratio: 61 %
RV % PRED: 95 %
RV: 2.34 L
TLC % PRED: 100 %
TLC: 5.08 L

## 2015-03-13 NOTE — Progress Notes (Signed)
PFT done today. 

## 2015-03-13 NOTE — Progress Notes (Signed)
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 Pulmonary Consult    Referred by Dr. Jacky Kindle.  Pt states she has had breathing problems for the past 2 yrs. She c/o increased DOE for the past month.  She has also had non prod cough. She was out of breath walking from lobby to exam room today, and sats on RA at rest were 63%--increased to    on 02 x 2 years  But doesn't use portable system at all.  Gradual onset progressive to 50 ft flat and slow assoc with dry cough day > noct rec Stop spiriva and start stiolto 2 puffs each am  Wear 02 3lpm at rest and 6lpm with activity for now Please see patient coordinator before you leave today  to schedule portable 02 titration   03/13/2015  f/u ov/Adamary Savary re: GOLD I copd with emphysematous features  Chief Complaint  Patient presents with  . Follow-up    Pt states that her breathing is about the same. No new co's. She has only been using o2 2lpm with sleep and prn daytime.   no better on stiolto than spiriva   No obvious day to day or daytime variability or assoc chronic cough or cp or chest tightness, subjective wheeze or overt sinus or hb symptoms. No unusual exp hx or h/o childhood pna/ asthma or knowledge of premature birth.  Sleeping ok without nocturnal  or early am exacerbation  of respiratory  c/o's or need for noct saba. Also denies any obvious fluctuation of symptoms with weather or environmental changes or  other aggravating or alleviating factors except as outlined above   Current Medications, Allergies, Complete Past Medical History, Past Surgical History, Family History, and Social History were reviewed in [employer] record.  ROS  The following are not active complaints unless bolded sore throat, dysphagia, dental problems, itching, sneezing,  nasal congestion or excess/ purulent secretions, ear ache,   fever, chills, sweats, unintended wt loss, classically pleuritic or exertional cp, hemoptysis,  orthopnea pnd or leg swelling, presyncope, palpitations, abdominal pain, anorexia, nausea, vomiting, diarrhea  or change in bowel or bladder habits, change in stools or urine, dysuria,hematuria,  rash, arthralgias, visual complaints, headache, numbness, weakness or ataxia or problems with walking or coordination,  change in mood/affect or memory.          Objective:   Physical Exam   amb frail elderly wf nad  03/13/2015        139   01/13/15 142 lb (64.411 kg)  09/20/14 140 lb 12.8 oz (63.866 kg)  04/30/14 145 lb 1.6 oz (65.817 kg)    Vital signs reviewed  HEENT: nl dentition, turbinates, and oropharynx. Nl external ear canals without cough reflex   NECK :  without JVD/Nodes/TM/ nl carotid upstrokes bilaterally   LUNGS: no acc muscle use, slt hyperresonant to percussion/ distant  bs  bilaterally without cough on insp or exp maneuvers  CV:  RRR  no s3 or murmur or increase in P2, no edema   ABD:  soft and nontender with end insp hoover's sign. No bruits or organomegaly, bowel sounds nl  MS:  warm without deformities, calf tenderness, cyanosis or clubbing  SKIN: warm and dry without lesions    NEURO:  alert, approp, no deficits      CXR PA and Lateral:   01/13/2015 :    I personally reviewed images and agree with radiology impression as follows:   COPD with no acute abnormalities.    Labs ordered/ reviewed:     Chemistry      Component Value Date/Time     NA 139 01/13/2015 1716   K 4.1 01/13/2015 1716   CL 100 01/13/2015 1716   CO2 25 01/13/2015 1716   BUN 21 01/13/2015 1716   CREATININE 1.02 01/13/2015 1716      Component Value Date/Time   CALCIUM 10.4 01/13/2015 1716   ALKPHOS 97 10/07/2014 0928   AST 13 10/07/2014 0928   ALT 10 10/07/2014 0928   BILITOT 0.6 10/07/2014 0928        Lab Results  Component Value Date   WBC 10.3 01/13/2015   HGB 16.9* 01/13/2015   HCT 50.1* 01/13/2015   MCV 105.0* 01/13/2015   PLT 312.0 01/13/2015         Lab Results  Component Value Date   TSH 2.24 01/13/2015     Lab Results  Component Value Date   PROBNP 327.0* 01/13/2015              Assessment & Plan:

## 2015-03-13 NOTE — Patient Instructions (Addendum)
Continue stiolto 2 puffs each am   Wear 02 3lpm at rest and 6lpm with activity for now - let me know if you have problem with the company   Please schedule a follow up office visit in 6 weeks

## 2015-03-14 ENCOUNTER — Encounter: Payer: Self-pay | Admitting: Internal Medicine

## 2015-03-14 NOTE — Assessment & Plan Note (Signed)
Sats 63% RA at rest 01/13/2015  > 88% on 3lpm   01/13/2015   Walked 6 x one lap @ 185 stopped due to desat at 1/2 lap at slow pace   rec as of 03/13/2015 =  3lpm 24/7 and 6lpm with ex pending amb 02 sats titration by dme/ordered

## 2015-03-14 NOTE — Assessment & Plan Note (Signed)
01/13/2015  extensive coaching HFA effectiveness =    90% from a baseline of < 50%  rec change to stiolto 2 each am  - PFT's  03/14/2015  FEV1 1.70 (91 % ) ratio 60  p no % improvement from saba with DLCO  21 % corrects to 22 % for alv volume    I had an extended discussion with the patient and husband(md) reviewing all relevant studies completed to date and  lasting 15 to 20 minutes of a 25 minute visit    1) only mild copd criteria but severe emphysema > spiriva prob best choice  2) chronic 02 dep resp failure > (see separate a/p)   Each maintenance medication was reviewed in detail including most importantly the difference between maintenance and prns and under what circumstances the prns are to be triggered using an action plan format that is not reflected in the computer generated alphabetically organized AVS.    Please see instructions for details which were reviewed in writing and the patient given a copy highlighting the part that I personally wrote and discussed at today's ov.

## 2015-04-07 ENCOUNTER — Ambulatory Visit (INDEPENDENT_AMBULATORY_CARE_PROVIDER_SITE_OTHER): Payer: Medicare Other | Admitting: Podiatry

## 2015-04-07 ENCOUNTER — Encounter: Payer: Self-pay | Admitting: Podiatry

## 2015-04-07 VITALS — BP 126/90 | HR 87 | Resp 14

## 2015-04-07 DIAGNOSIS — B351 Tinea unguium: Secondary | ICD-10-CM | POA: Diagnosis not present

## 2015-04-07 DIAGNOSIS — M79676 Pain in unspecified toe(s): Secondary | ICD-10-CM | POA: Diagnosis not present

## 2015-04-07 NOTE — Patient Instructions (Signed)
Over the counter medication for toenail fungus is called fungi-nail  

## 2015-04-07 NOTE — Progress Notes (Signed)
   Subjective:    Patient ID: Heidi Morales, female    DOB: Jul 21, 1930, 82 y.o.   MRN: [id number]  HPI  82 year old female presents the also for concerns of thick, painful, elongated toenails for which she cannot trim herself. Denies any surrounding redness or drainage. No other complaints at this time.  Review of Systems  All other systems reviewed and are negative.      Objective:   Physical Exam General: AAO x3, NAD  Dermatological: Nails appear to be hypertrophic, dystrophic, brittle, discolored, elongated 10. There is tenderness in nails 1-5 bilaterally. No surrounding erythema or drainage. No open lesions or pre-ulcerative lesions.  Vascular: Dorsalis Pedis artery and Posterior Tibial artery pedal pulses are 2/4 bilateral with immedate capillary fill time. Pedal hair growth present. There is no pain with calf compression, swelling, warmth, erythema.   Neruologic: Grossly intact via light touch bilateral. Vibratory intact via tuning fork bilateral. Protective threshold with Semmes Wienstein monofilament intact to all pedal sites bilateral. Patellar and Achilles deep tendon reflexes 2+ bilateral. No Babinski or clonus noted bilateral.   Musculoskeletal: No gross boney pedal deformities bilateral. No pain, crepitus, or limitation noted with foot and ankle range of motion bilateral. Muscular strength 5/5 in all groups tested bilateral.  Gait: Unassisted, Nonantalgic.      Assessment & Plan:   82 year old female with symptomatic onychomycosis  -Treatment options discussed including all alternatives, risks, and complications -Etiology of symptoms were discussed -Nails debrided 10 without complications or bleeding.  -Discussed daily foot inspection.  -Follow-up in 3 months or sooner if any problems arise. In the meantime, encouraged to call the office with any questions, concerns, change in symptoms.   Ovid Curd, DPM

## 2015-04-24 ENCOUNTER — Encounter: Payer: Self-pay | Admitting: Internal Medicine

## 2015-04-24 ENCOUNTER — Ambulatory Visit (INDEPENDENT_AMBULATORY_CARE_PROVIDER_SITE_OTHER): Payer: Medicare Other | Admitting: Internal Medicine

## 2015-04-24 VITALS — BP 108/62 | HR 90 | Ht 64.0 in | Wt 139.0 lb

## 2015-04-24 DIAGNOSIS — J449 Chronic obstructive pulmonary disease, unspecified: Secondary | ICD-10-CM

## 2015-04-24 DIAGNOSIS — J9611 Chronic respiratory failure with hypoxia: Secondary | ICD-10-CM

## 2015-04-24 NOTE — Assessment & Plan Note (Signed)
Sats 63% RA at rest 01/13/2015  > 88% on 3lpm   01/13/2015   Walked 6 x one lap @ 185 stopped due to desat at 1/2 lap at slow pace  -- 04/24/2015  Walked 5lpm POC pulsed  x one lap @ 185 stopped due to sob and desat to 78% "faster and further than I usually walk"    rec as of 04/24/2015 =  3lpm 24/7 and 5lpm  POC pulsed since not interested in any escalation of rx at this point/ confirmed with husband   Did discuss risks of polycythemia and cor pulmonale

## 2015-04-24 NOTE — Patient Instructions (Addendum)
No change in pulmonary medications  Ok to continue the portable concentrator at 3lpm at rest but turn it up to 5 lpm with any activity other than sitting to prevent your blood from getting thicker  Please schedule a follow up visit in 3 months but call sooner if needed

## 2015-04-24 NOTE — Assessment & Plan Note (Signed)
01/13/2015     rec change to stiolto 2 each am  - PFT's  03/14/2015  FEV1 1.70 (91 % ) ratio 60  p no % improvement from saba with DLCO  21 % corrects to 22 % for alv volume  - 04/24/2015  extensive coaching HFA effectiveness =    90% with respimat    Marked improvement from baseline in pt with severe E-COPD so no change in rx needed   I had an extended discussion with the patient/physician husband  reviewing all relevant studies completed to date and  lasting 15 to 20 minutes of a 25 minute visit    Each maintenance medication was reviewed in detail including most importantly the difference between maintenance and prns and under what circumstances the prns are to be triggered using an action plan format that is not reflected in the computer generated alphabetically organized AVS.    Please see instructions for details which were reviewed in writing and the patient given a copy highlighting the part that I personally wrote and discussed at today's ov.

## 2015-04-24 NOTE — Progress Notes (Signed)
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 Pulmonary Consult    Referred by Dr. Jacky Kindle.  Pt states she has had breathing problems for the past 2 yrs. She c/o increased DOE for the past month.  She has also had non prod cough. She was out of breath walking from lobby to exam room today, and sats on RA at rest were 63%--increased to    on 02 x 2 years  But doesn't use portable system at all.  Gradual onset progressive to 50 ft flat and slow assoc with dry cough day > noct rec Stop spiriva and start stiolto 2 puffs each am  Wear 02 3lpm at rest and 6lpm with activity for now Please see patient coordinator before you leave today  to schedule portable 02 titration   03/13/2015  f/u ov/Wert re: GOLD I copd with emphysematous features  Chief Complaint  Patient presents with  . Follow-up    Pt states that her breathing is about the same. No new co's. She has only been using o2 2lpm with sleep and prn daytime.   no better on stiolto than spiriva  rec Continue stiolto 2 puffs each am  Wear 02 3lpm at rest and 6lpm with activity for now > did not do    04/24/2015  f/u ov/Wert re:  GOLD I copd / emphysematous features /02 dep at 3lpm rest/ 6lpm walking  Chief Complaint  Patient presents with  . Follow-up    Breathing is doing well. She has been using o2 3lpm 24/7- if gets winded when walking she slows down her pace.   sleeping well on 3lpm concentrator but  spends most of the day downstairs on pulsed 3lpm per POC and not increasing with activity as rec (present POC only goes to 5lpm). Able to do HT slow pace/ leaning on cart on 3lpm  No obvious day to day or daytime variability or assoc excess/ purulent sputum or mucus plugs  or cp or chest tightness, subjective wheeze or overt sinus or hb symptoms. No unusual exp hx or h/o childhood pna/ asthma or knowledge of premature birth.  Sleeping ok without nocturnal  or early am exacerbation  of respiratory  c/o's or need for noct saba. Also denies any obvious fluctuation of symptoms with weather or environmental changes or other aggravating or alleviating factors except as outlined above   Current Medications, Allergies, Complete Past Medical History, Past Surgical History, Family History, and Social History were reviewed in [employer] record.  ROS  The following are not active complaints unless bolded sore throat, dysphagia, dental problems, itching, sneezing,  nasal congestion or excess/ purulent secretions, ear ache,   fever, chills, sweats, unintended wt loss, classically pleuritic or exertional cp, hemoptysis,  orthopnea pnd or leg swelling, presyncope, palpitations, abdominal pain, anorexia, nausea, vomiting, diarrhea  or change in bowel  or bladder habits, change in stools or urine, dysuria,hematuria,  rash, arthralgias, visual complaints, headache, numbness, weakness or ataxia or problems with walking or coordination,  change in mood/affect or memory.                     Objective:  Physical Exam  amb frail elderly wf nad  04/24/2015           03/13/2015        139   01/13/15 142 lb (64.411 kg)  09/20/14 140 lb 12.8 oz (63.866 kg)  04/30/14 145 lb 1.6 oz (65.817 kg)    Vital signs reviewed  HEENT: nl dentition, turbinates, and oropharynx. Nl external ear canals without cough reflex   NECK :  without JVD/Nodes/TM/ nl carotid upstrokes bilaterally   LUNGS: no acc  muscle use, slt hyperresonant to percussion/ distant bs  bilaterally without cough on insp or exp maneuvers  CV:  RRR  no s3 or murmur or increase in P2, no edema   ABD:  soft and nontender with end insp hoover's sign. No bruits or organomegaly, bowel sounds nl  MS:  warm without deformities, calf tenderness, cyanosis or clubbing  SKIN: warm and dry without lesions    NEURO:  alert, approp, no deficits      CXR PA and Lateral:   01/13/2015 :    I personally reviewed images and agree with radiology impression as follows:   COPD with no acute abnormalities.    Labs ordered/ reviewed:     Chemistry      Component Value Date/Time   NA 139 01/13/2015 1716   K 4.1 01/13/2015 1716   CL 100 01/13/2015 1716   CO2 25 01/13/2015 1716   BUN 21 01/13/2015 1716   CREATININE 1.02 01/13/2015 1716      Component Value Date/Time   CALCIUM 10.4 01/13/2015 1716   ALKPHOS 97 10/07/2014 0928   AST 13 10/07/2014 0928   ALT 10 10/07/2014 0928   BILITOT 0.6 10/07/2014 0928        Lab Results  Component Value Date   WBC 10.3 01/13/2015   HGB 16.9* 01/13/2015   HCT 50.1* 01/13/2015   MCV 105.0* 01/13/2015   PLT 312.0 01/13/2015         Lab Results  Component Value Date   TSH 2.24 01/13/2015     Lab Results  Component Value Date   PROBNP 327.0* 01/13/2015          Assessment & Plan:

## 2015-05-06 ENCOUNTER — Ambulatory Visit (INDEPENDENT_AMBULATORY_CARE_PROVIDER_SITE_OTHER): Payer: Medicare Other | Admitting: *Deleted

## 2015-05-06 ENCOUNTER — Telehealth: Payer: Self-pay | Admitting: Cardiovascular Disease

## 2015-05-06 ENCOUNTER — Ambulatory Visit (INDEPENDENT_AMBULATORY_CARE_PROVIDER_SITE_OTHER): Payer: Medicare Other

## 2015-05-06 DIAGNOSIS — R42 Dizziness and giddiness: Secondary | ICD-10-CM

## 2015-05-06 NOTE — Telephone Encounter (Signed)
New message   Patient husband calling C/O dizziness, low heart rate this am 40-46 . No chest pain. Always SOB due to H/O COPD.   Would like a nurse or MD to call him back today .

## 2015-05-06 NOTE — Progress Notes (Signed)
Pt notes increased dizziness, past 2 days - all the time and not limited to activity or being ambulatory. She is noticing a little more SOB - is chronically O2 dependent. No ankle edema. No obvious wt gain.  EKG performed, interpreted by Wilburt FinlayBryan Hager, PA. NSR. Recommendation to start pt on 2 week event monitor for observation d/t symptoms.  Event monitor placed. Instructions given to pt and husband, understanding verbalized.

## 2015-05-06 NOTE — Patient Instructions (Signed)
Cardiac Event Monitoring °A cardiac event monitor is a small recording device used to help detect abnormal heart rhythms (arrhythmias). The monitor is used to record heart rhythm when noticeable symptoms such as the following occur: °· Fast heartbeats (palpitations), such as heart racing or fluttering. °· Dizziness. °· Fainting or light-headedness. °· Unexplained weakness. °The monitor is wired to two electrodes placed on your chest. Electrodes are flat, sticky disks that attach to your skin. The monitor can be worn for up to 30 days. You will wear the monitor at all times, except when bathing.  °HOW TO USE YOUR CARDIAC EVENT MONITOR °A technician will prepare your chest for the electrode placement. The technician will show you how to place the electrodes, how to work the monitor, and how to replace the batteries. Take time to practice using the monitor before you leave the office. Make sure you understand how to send the information from the monitor to your health care provider. This requires a telephone with a landline, not a cell phone. You need to: °· Wear your monitor at all times, except when you are in water: °¨ Do not get the monitor wet. °¨ Take the monitor off when bathing. Do not swim or use a hot tub with it on. °· Keep your skin clean. Do not put body lotion or moisturizer on your chest. °· Change the electrodes daily or any time they stop sticking to your skin. You might need to use tape to keep them on. °· It is possible that your skin under the electrodes could become irritated. To keep this from happening, try to put the electrodes in slightly different places on your chest. However, they must remain in the area under your left breast and in the upper right section of your chest. °· Make sure the monitor is safely clipped to your clothing or in a location close to your body that your health care provider recommends. °· Press the button to record when you feel symptoms of heart trouble, such as  dizziness, weakness, light-headedness, palpitations, thumping, shortness of breath, unexplained weakness, or a fluttering or racing heart. The monitor is always on and records what happened slightly before you pressed the button, so do not worry about being too late to get good information. °· Keep a diary of your activities, such as walking, doing chores, and taking medicine. It is especially important to note what you were doing when you pushed the button to record your symptoms. This will help your health care provider determine what might be contributing to your symptoms. The information stored in your monitor will be reviewed by your health care provider alongside your diary entries. °· Send the recorded information as recommended by your health care provider. It is important to understand that it will take some time for your health care provider to process the results. °· Change the batteries as recommended by your health care provider. °SEEK IMMEDIATE MEDICAL CARE IF:  °· You have chest pain. °· You have extreme difficulty breathing or shortness of breath. °· You develop a very fast heartbeat that persists. °· You develop dizziness that does not go away. °· You faint or constantly feel you are about to faint. °  °This information is not intended to replace advice given to you by your health care provider. Make sure you discuss any questions you have with your health care provider. °  °Document Released: 11/18/2007 Document Revised: 03/01/2014 Document Reviewed: 08/07/2012 °Elsevier Interactive Patient Education ©2016 [employer] ° °

## 2015-05-06 NOTE — Telephone Encounter (Signed)
EKG visit & monitor placement done.

## 2015-05-06 NOTE — Telephone Encounter (Signed)
Returned call to patient. Short of breath which is ongoing, denies acute symptoms w/e of dizziness/lightheadedness on standing for past 2 days. Bradycardia observed by husband. Pulse taken this AM, 40 bpm, then 46 bpm. No BP obtained. Denies any recent med changes.  Advised ER visit appropriate but they stated reluctance in going to hospital. Advised I could set up for EKG here and have recommendation given by [facility]. Pt will come this morning for EKG check.

## 2015-05-06 NOTE — Addendum Note (Signed)
Addended by: Neta EhlersRUITT, ANGELA M on: 05/06/2015 05:14 PM   Modules accepted: Orders

## 2015-05-08 NOTE — Addendum Note (Signed)
Addended by: Lindell SparELKINS, Dionisios Ricci M on: 05/08/2015 04:18 PM   Modules accepted: Orders

## 2015-05-23 ENCOUNTER — Telehealth: Payer: Self-pay | Admitting: *Deleted

## 2015-05-23 NOTE — Telephone Encounter (Signed)
-----   Message from Chilton Siiffany Santee, MD sent at 05/09/2015  1:45 PM EDT ----- Please schedule for follow up in 3 weeks after monitor is completed.

## 2015-05-23 NOTE — Telephone Encounter (Signed)
Left message to call back   Appointment scheduled for next week

## 2015-05-26 NOTE — Telephone Encounter (Signed)
Patient ( Per Dr.Sun , Mrs. Heidi Morales Sees Dr. Allyson SabalBerry ) Above per cancellation note in Epic Patient did reschedule with Dr Allyson SabalBerry

## 2015-05-30 ENCOUNTER — Ambulatory Visit: Payer: BLUE CROSS/BLUE SHIELD | Admitting: Cardiovascular Disease

## 2015-06-09 ENCOUNTER — Ambulatory Visit: Payer: Medicare Other | Admitting: Sports Medicine

## 2015-06-16 ENCOUNTER — Encounter: Payer: Self-pay | Admitting: Podiatry

## 2015-06-16 ENCOUNTER — Ambulatory Visit (INDEPENDENT_AMBULATORY_CARE_PROVIDER_SITE_OTHER): Payer: Medicare Other | Admitting: Podiatry

## 2015-06-16 DIAGNOSIS — M79676 Pain in unspecified toe(s): Secondary | ICD-10-CM

## 2015-06-16 DIAGNOSIS — B351 Tinea unguium: Secondary | ICD-10-CM

## 2015-06-16 NOTE — Progress Notes (Signed)
Subjective:     Patient ID: Heidi F BodneDollene Clevelandr, female   DOB: 12/22/1930, [age] y.o.   MRN: [id number]  HPI patient presents with painful nailbeds 1-5 both feet that are thick yellow brittle and become painful when wearing shoe gear.   Review of Systems     Objective:   Physical Exam Neurovascular status was intact with yellow brittle nailbeds 1-5 both feet that are painful when pressed    Assessment:     Mycotic nail infection with pain 1-5 both feet    Plan:     Debride painful nailbeds 1-5 both feet with no iatrogenic bleeding and reappoint for revisit

## 2015-06-18 ENCOUNTER — Ambulatory Visit: Payer: BLUE CROSS/BLUE SHIELD | Admitting: Cardiovascular Disease

## 2015-06-20 ENCOUNTER — Encounter: Payer: Self-pay | Admitting: Cardiovascular Disease

## 2015-06-20 ENCOUNTER — Ambulatory Visit (INDEPENDENT_AMBULATORY_CARE_PROVIDER_SITE_OTHER): Payer: Medicare Other | Admitting: Cardiovascular Disease

## 2015-06-20 VITALS — BP 140/56 | HR 58 | Ht 64.0 in | Wt 138.0 lb

## 2015-06-20 DIAGNOSIS — E785 Hyperlipidemia, unspecified: Secondary | ICD-10-CM | POA: Diagnosis not present

## 2015-06-20 DIAGNOSIS — I2510 Atherosclerotic heart disease of native coronary artery without angina pectoris: Secondary | ICD-10-CM | POA: Diagnosis not present

## 2015-06-20 DIAGNOSIS — I2583 Coronary atherosclerosis due to lipid rich plaque: Principal | ICD-10-CM

## 2015-06-20 DIAGNOSIS — I10 Essential (primary) hypertension: Secondary | ICD-10-CM

## 2015-06-20 DIAGNOSIS — R0600 Dyspnea, unspecified: Secondary | ICD-10-CM

## 2015-06-20 DIAGNOSIS — I350 Nonrheumatic aortic (valve) stenosis: Secondary | ICD-10-CM

## 2015-06-20 NOTE — Progress Notes (Signed)
06/20/2015 BERGEN MELLE   07-08-1930  [id number]  Primary Physician ARONSON,RICHARD A, MD Primary Cardiologist: Runell Gess MD [name]   HPI:  Ms. Camillo Flaming is an 82 year old married Caucasian female mother of 3 children whose husband is retired Therapist, sports. I last saw her in the office 09/20/14. She was referred by Dr. Jacky Kindle for cardiovascular evaluation because of PVCs noted on a recent electrocardiogram and a newly auscultated murmur. She has been taken care of by Dr. Melburn Popper her in the past. She has a history of coronary artery disease status post myocardial infarction 8 years ago and intervention [facility] with a stent. She's never had a stroke. She denies chest pain but has chronic shortness of breath from COPD. Her cardiac risk factors include hypertension and hyperlipidemia as well as remote tobacco abuse. She had a 2-D echo performed 09/20/13 revealing normal LV systolic function with mild aortic stenosis. She did complain of left scapular pain which I suspect is referred from cervical radiculopathy. This resolved with conservative measures including a heating pad and Motrin. Since I saw in July 2016 she is moving clinically stable.She does have increasing dyspnea on exertion on the backdrop of chronic dyspnea and continuous home O2 related to COPD. She did have an event monitor that showed PVCs which is not new for her.   Current Outpatient Prescriptions  Medication Sig Dispense Refill  . amLODipine (NORVASC) 5 MG tablet Take 5 mg by mouth daily.      Marland Kitchen aspirin 325 MG tablet Take 325 mg by mouth daily.      Marland Kitchen BYSTOLIC 10 MG tablet Take 1 tablet by mouth daily.  11  . calcium citrate-vitamin D (CITRACAL+D) 315-200 MG-UNIT per tablet Take 1 tablet by mouth daily.      . Cholecalciferol (VITAMIN D PO) Take 1 tablet by mouth daily.      . hydrochlorothiazide (HYDRODIURIL) 25 MG tablet Take 25 mg by mouth daily.    . Multiple Vitamins-Minerals (ICAPS PO) Take 1  capsule by mouth 2 (two) times daily.     . OXYGEN 3lpm 24/7- advised to increased to 6lpm with exertion    . simvastatin (ZOCOR) 10 MG tablet Take 10 mg by mouth daily.    . Tiotropium Bromide-Olodaterol (STIOLTO RESPIMAT) 2.5-2.5 MCG/ACT AERS Inhale 2 puffs into the lungs daily. 1 Inhaler 11  . valsartan (DIOVAN) 320 MG tablet Take 320 mg by mouth daily.       No current facility-administered medications for this visit.    Allergies  Allergen Reactions  . Promethazine Hcl Other (See Comments)    Drop in Blood pressure    Social History   Social History  . Marital Status: Married    Spouse Name: N/A  . Number of Children: N/A  . Years of Education: N/A   Occupational History  . Not on file.   Social History Main Topics  . Smoking status: Former Smoker -- 1.00 packs/day for 50 years    Types: Cigarettes    Quit date: 02/22/2006  . Smokeless tobacco: Never Used  . Alcohol Use: No  . Drug Use: Not on file  . Sexual Activity: Not on file   Other Topics Concern  . Not on file   Social History Narrative     Review of Systems: General: negative for chills, fever, night sweats or weight changes.  Cardiovascular: negative for chest pain, dyspnea on exertion, edema, orthopnea, palpitations, paroxysmal nocturnal dyspnea or shortness of breath Dermatological: negative  for rash Respiratory: negative for cough or wheezing Urologic: negative for hematuria Abdominal: negative for nausea, vomiting, diarrhea, bright red blood per rectum, melena, or hematemesis Neurologic: negative for visual changes, syncope, or dizziness All other systems reviewed and are otherwise negative except as noted above.    Blood pressure 140/56, pulse 58, height 5\' 4"  (1.626 m), weight 138 lb (62.596 kg).  General appearance: alert and no distress Neck: no adenopathy, no carotid bruit, no JVD, supple, symmetrical, trachea midline and thyroid not enlarged, symmetric, no tenderness/mass/nodules Lungs:  clear to auscultation bilaterally Heart: regular rate and rhythm, S1, S2 normal, no murmur, click, rub or gallop Extremities: extremities normal, atraumatic, no cyanosis or edema  EKG ot performed today  ASSESSMENT AND PLAN:   CAD (coronary artery disease) History of CAD status post myocardial infarction 9 years ago with intervention and stenting at [facility].She currently denies chest pain.  Hyperlipidemia History of hyperlipidemia on statin therapy followed by her PCP  Aortic stenosis History of mild aortic stenosis by 2-D echo 09/20/13 with a valve area of 1.7 cm2 and a peak gradient of 14 mmHg.  Essential hypertension History of hypertension blood pressure measured at 140/56. She is on amlodipine, Bystolic ,hydrochlorothiazide and Diovan. Continue current meds at current dosing  Dyspnea History of chronic dyspnea on exertion related to COPD on 3 L of  continuous oxygen followed by Dr. Artist BeachWert       Zyair Rhein J. Ednamae Schiano MD [facility]S HospitalFACP,FACC,FAHA, [facility]FSCAI 06/20/2015 9:34 AM

## 2015-06-20 NOTE — Patient Instructions (Signed)
Medication Instructions:  Your physician recommends that you continue on your current medications as directed. Please refer to the Current Medication list given to you today.   Labwork: none  Testing/Procedures: none  Follow-Up: Your physician wants you to follow-up in: 6 months with Dr. Allyson SabalBerry. You will receive a reminder letter in the mail two months in advance. If you don't receive a letter, please call our office to schedule the follow-up appointment.   Any Other Special Instructions Will Be Listed Below (If Applicable).   Local Medical Supply Stores:    [facility] Phone:[phone]  Located on [address]  [facility]   Phone: [phone]  Located on [address]    If you need a refill on your cardiac medications before your next appointment, please call your pharmacy.

## 2015-06-20 NOTE — Assessment & Plan Note (Signed)
History of mild aortic stenosis by 2-D echo 09/20/13 with a valve area of 1.7 cm2 and a peak gradient of 14 mmHg.

## 2015-06-20 NOTE — Assessment & Plan Note (Signed)
History of hyperlipidemia on statin therapy followed by her PCP. 

## 2015-06-20 NOTE — Assessment & Plan Note (Signed)
History of chronic dyspnea on exertion related to COPD on 3 L of  continuous oxygen followed by Dr. Sherene SiresWert

## 2015-06-20 NOTE — Assessment & Plan Note (Signed)
History of CAD status post myocardial infarction 9 years ago with intervention and stenting at [facility].She currently denies chest pain.

## 2015-06-20 NOTE — Assessment & Plan Note (Signed)
History of hypertension blood pressure measured at 140/56. She is on amlodipine, Bystolic ,hydrochlorothiazide and Diovan. Continue current meds at current dosing

## 2015-07-14 ENCOUNTER — Ambulatory Visit: Payer: Medicare Other | Admitting: Sports Medicine

## 2015-07-25 ENCOUNTER — Encounter: Payer: Self-pay | Admitting: Internal Medicine

## 2015-07-25 ENCOUNTER — Ambulatory Visit (INDEPENDENT_AMBULATORY_CARE_PROVIDER_SITE_OTHER): Payer: Medicare Other | Admitting: Internal Medicine

## 2015-07-25 VITALS — BP 122/74 | HR 77 | Ht 64.0 in | Wt 136.0 lb

## 2015-07-25 DIAGNOSIS — J9611 Chronic respiratory failure with hypoxia: Secondary | ICD-10-CM | POA: Diagnosis not present

## 2015-07-25 DIAGNOSIS — J449 Chronic obstructive pulmonary disease, unspecified: Secondary | ICD-10-CM

## 2015-07-25 DIAGNOSIS — I2510 Atherosclerotic heart disease of native coronary artery without angina pectoris: Secondary | ICD-10-CM | POA: Diagnosis not present

## 2015-07-25 NOTE — Progress Notes (Signed)
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 Pulmonary Consult    Referred by Dr. Jacky KindleAronson.  Pt states she has had breathing problems for the past 2 yrs. She c/o increased DOE for the past month.  She has also had non prod cough. She was out of breath walking from lobby to exam room today, and sats on RA at rest were 63%--increased to    on 02 x 2 years  But doesn't use portable system at all.  Gradual onset progressive to 50 ft flat and slow assoc with dry cough day > noct rec Stop spiriva and start stiolto 2 puffs each am  Wear 02 3lpm at rest and 6lpm with activity for now Please see patient coordinator before you leave today  to schedule portable 02 titration   03/13/2015  f/u ov/Wert re: GOLD I copd with emphysematous features  Chief Complaint  Patient presents with  . Follow-up    Pt states that her breathing is about the same. No new co's. She has only been using o2 2lpm with sleep and prn daytime.   no better on stiolto than spiriva  rec Continue stiolto 2 puffs each am  Wear 02 3lpm at rest and 6lpm with activity for now > did not do    04/24/2015  f/u ov/Wert re:  GOLD I copd / emphysematous features /02 dep at 3lpm rest/ 6lpm walking  Chief Complaint  Patient presents with  . Follow-up    Breathing is doing well. She has been using o2 3lpm 24/7- if gets winded when walking she slows down her pace.   sleeping well on 3lpm concentrator but  spends most of the day downstairs on pulsed 3lpm per POC and not increasing with activity as rec (present POC only goes to 5lpm). Able to do HT slow pace/ leaning on cart on 3lpm rec No change in pulmonary medications Ok to continue the portable concentrator at 3lpm at rest but turn it up to 5 lpm with any activity other than sitting to prevent your blood from getting thicker Please schedule a follow up visit in 3 months but call sooner if needed     07/25/2015  f/u ov/Wert re:  GOLD I copd/ stiolto and 02  Chief Complaint  Patient presents with  . Follow-up    Breathing progressively worse since the last visit. She still has never increased o2 to 5 with exertion per last ov recs.   MMRC3 = can't walk 100 yards even at a slow pace at a flat grade s stopping due to sob    No obvious day to day or daytime variability or assoc excess/ purulent sputum or mucus plugs  or cp or chest tightness, subjective wheeze or overt sinus or hb symptoms. No unusual exp hx or h/o childhood pna/ asthma or knowledge of premature birth.  Sleeping ok  without nocturnal  or early am exacerbation  of respiratory  c/o's or need for noct saba. Also denies any obvious fluctuation of symptoms with weather or environmental changes or other aggravating or alleviating factors except as outlined above   Current Medications, Allergies, Complete Past Medical History, Past Surgical History, Family History, and Social History were reviewed in [employer] record.  ROS  The following are not active complaints unless bolded sore throat, dysphagia, dental problems, itching, sneezing,  nasal congestion or excess/ purulent secretions, ear ache,   fever, chills, sweats, unintended wt loss, classically pleuritic or exertional cp, hemoptysis,  orthopnea pnd or leg swelling, presyncope, palpitations, abdominal pain, anorexia, nausea, vomiting, diarrhea  or change in bowel or bladder habits, change in stools or urine,  dysuria,hematuria,  rash, arthralgias, visual complaints, headache, numbness, weakness or ataxia or problems with walking or coordination,  change in mood/affect or memory.                     Objective:  Physical Exam  amb frail elderly wf nad  07/25/2015          136      03/13/2015        139   01/13/15 142 lb (64.411 kg)  09/20/14 140 lb 12.8 oz (63.866 kg)  04/30/14 145 lb 1.6 oz (65.817 kg)    Vital signs reviewed  HEENT: nl dentition, turbinates, and oropharynx. Nl external ear canals without cough reflex   NECK :  without JVD/Nodes/TM/ nl carotid upstrokes bilaterally   LUNGS: no acc muscle use, slt hyperresonant to percussion/ distant bs  bilaterally without cough on insp or exp maneuvers  CV:  RRR  no s3 or murmur or increase in P2, no edema   ABD:  soft and nontender with end insp hoover's sign. No bruits or organomegaly, bowel sounds nl  MS:  warm without deformities, calf tenderness, cyanosis or clubbing  SKIN: warm and dry without lesions    NEURO:  alert, approp, no deficits    CXR PA and Lateral:   01/13/2015 :    I personally reviewed images and agree with radiology impression as follows:   COPD with no acute abnormalities.      Assessment & Plan:

## 2015-07-25 NOTE — Assessment & Plan Note (Signed)
01/13/2015     rec change to stiolto 2 each am  - PFT's  03/14/2015  FEV1 1.70 (91 % ) ratio 60  p no % improvement from saba with DLCO  21 % corrects to 22 % for alv volume  - 04/24/2015  extensive coaching HFA effectiveness =    90% with respimat   Remains with classic Group B symptoms s exac typical of E-copd for which laba/lama and 02 best rx    I had an extended discussion with the patient reviewing all relevant studies completed to date and  lasting 15 to 20 minutes of a 25 minute visit    Strongly rec rehab > declined for now  Each maintenance medication was reviewed in detail including most importantly the difference between maintenance and prns and under what circumstances the prns are to be triggered using an action plan format that is not reflected in the computer generated alphabetically organized AVS.    Please see instructions for details which were reviewed in writing and the patient given a copy highlighting the part that I personally wrote and discussed at today's ov.

## 2015-07-25 NOTE — Patient Instructions (Signed)
For drainage / throat tickle try take CHLORPHENIRAMINE  4 mg - take one every 4 hours as needed - available over the counter- may cause drowsiness so start with just a bedtime dose or two and see how you tolerate it before trying in daytime    Ok to continue the portable concentrator at 3lpm at rest but turn it up to 5 lpm with any activity other than sitting to prevent your blood from getting thicker  Please schedule a follow up visit in 3 months but call sooner if needed - if not improving think about letting us refer to rehab

## 2015-07-25 NOTE — Assessment & Plan Note (Signed)
Sats 63% RA at rest 01/13/2015  > 88% on 3lpm   01/13/2015   Walked 6 x one lap @ 185 stopped due to desat at 1/2 lap at slow pace  -- 04/24/2015  Walked 5lpm POC pulsed  x one lap @ 185 stopped due to sob and desat to 78% "faster and further than I usually walk"    rec as of 07/25/2015 =  3lpm 24/7 and 5lpm  POC pulsed with any activity   Borderline polycythemic, not hypercarbic > no change recs

## 2015-08-18 ENCOUNTER — Ambulatory Visit (INDEPENDENT_AMBULATORY_CARE_PROVIDER_SITE_OTHER): Payer: Medicare Other | Admitting: Podiatry

## 2015-08-18 ENCOUNTER — Encounter: Payer: Self-pay | Admitting: Podiatry

## 2015-08-18 DIAGNOSIS — B351 Tinea unguium: Secondary | ICD-10-CM

## 2015-08-18 DIAGNOSIS — M79676 Pain in unspecified toe(s): Secondary | ICD-10-CM

## 2015-08-18 NOTE — Progress Notes (Signed)
Patient ID: Heidi Morales, female   DOB: 06/08/1930, 82 y.o.   MRN: [id number]  Subjective: 82 y.o. returns the office today for painful, elongated, thickened toenails which she cannot trim herself. Denies any redness or drainage around the nails. Denies any acute changes since last appointment and no new complaints today. Denies any systemic complaints such as fevers, chills, nausea, vomiting.   Objective: AAO 3, NAD DP/PT pulses palpable, CRT less than 3 seconds Nails hypertrophic, dystrophic, elongated, brittle, discolored 10. There is tenderness overlying the nails 1-5 bilaterally. There is no surrounding erythema or drainage along the nail sites. No open lesions or pre-ulcerative lesions are identified. No other areas of tenderness bilateral lower extremities. No overlying edema, erythema, increased warmth. No pain with calf compression, swelling, warmth, erythema.  Assessment: Patient presents with symptomatic onychomycosis  Plan: -Treatment options including alternatives, risks, complications were discussed -Nails sharply debrided 10 without complication/bleeding. -Discussed daily foot inspection. If there are any changes, to call the office immediately.  -Follow-up in 3 months or sooner if any problems are to arise. In the meantime, encouraged to call the office with any questions, concerns, changes symptoms.  Ovid CurdMatthew Wagoner, DPM

## 2015-10-02 ENCOUNTER — Telehealth: Payer: Self-pay | Admitting: Cardiovascular Disease

## 2015-10-02 NOTE — Telephone Encounter (Signed)
New message       Dr Heidi Morales request to talk to a nurse or Dr Allyson SabalBerry regarding Mrs Heidi Morales developing exertional dyspnea.   Please call

## 2015-10-02 NOTE — Telephone Encounter (Signed)
Scheduler aware and will call patient to add on as the schedule will need rearranging.

## 2015-10-02 NOTE — Telephone Encounter (Signed)
OK to add in

## 2015-10-02 NOTE — Telephone Encounter (Signed)
Spoke to Dr. Bunnie PionBodner, patient's husband. He notes pt's history of COPD, MI ~ 10 years ago. He reports that patient has been having new symptoms: exertional dyspnea, developed some orthopnea  she is on continuous O2. she breaks from o2 when traveling between floors in home on [address] stairlift She routinely "lives" in the 70-80% range for O2. o2 dips to mid 50s while breaking from continuous O2, and it takes her several minutes to recompensate. pulse often times in 40s and 50s - Dr. Bunnie PionBodner thinks her pulse should be going up more if this was just a lung/oxygenation issue. po2 78%, pulse 80 when checked at rest this AM. He would like to have pt seen by Dr. Allyson SabalBerry to discuss cardiac causes.  She is followed by Dr. Sherene SiresWert for pulmonary issues, but is not scheduled to see him until September. Husband insistent that this is not a lung issue bc "that organ doesn't deteriorate that fast." She is neg for S&S of URI, etc. No swelling on legs - Dr. Bunnie PionBodner states he is not concerned for left sided HF.  States her new dyspnea noticeable in last 3 days. He is aware to take pt to ED if continued worsening of symptoms. He thinks she can wait until next week to be seen, but refused a PA appt.  Dr. Allyson SabalBerry is scheduled in office next week but days are full.  Will route to see if pt OK as add-in and for further recommendations.

## 2015-10-06 NOTE — Telephone Encounter (Signed)
appt confirmed by husband.

## 2015-10-08 ENCOUNTER — Encounter: Payer: Self-pay | Admitting: Cardiovascular Disease

## 2015-10-08 ENCOUNTER — Ambulatory Visit (INDEPENDENT_AMBULATORY_CARE_PROVIDER_SITE_OTHER): Payer: Medicare Other | Admitting: Cardiovascular Disease

## 2015-10-08 ENCOUNTER — Encounter (INDEPENDENT_AMBULATORY_CARE_PROVIDER_SITE_OTHER): Payer: Self-pay

## 2015-10-08 VITALS — BP 142/80 | HR 92 | Ht 64.0 in | Wt 129.8 lb

## 2015-10-08 DIAGNOSIS — R0600 Dyspnea, unspecified: Secondary | ICD-10-CM

## 2015-10-08 DIAGNOSIS — I2510 Atherosclerotic heart disease of native coronary artery without angina pectoris: Secondary | ICD-10-CM | POA: Diagnosis not present

## 2015-10-08 DIAGNOSIS — E785 Hyperlipidemia, unspecified: Secondary | ICD-10-CM

## 2015-10-08 DIAGNOSIS — I10 Essential (primary) hypertension: Secondary | ICD-10-CM

## 2015-10-08 DIAGNOSIS — Z79899 Other long term (current) drug therapy: Secondary | ICD-10-CM | POA: Diagnosis not present

## 2015-10-08 DIAGNOSIS — I2583 Coronary atherosclerosis due to lipid rich plaque: Secondary | ICD-10-CM

## 2015-10-08 DIAGNOSIS — I350 Nonrheumatic aortic (valve) stenosis: Secondary | ICD-10-CM

## 2015-10-08 LAB — BASIC METABOLIC PANEL
BUN: 22 mg/dL (ref 7–25)
CHLORIDE: 105 mmol/L (ref 98–110)
CO2: 22 mmol/L (ref 20–31)
Calcium: 10.3 mg/dL (ref 8.6–10.4)
Creat: 0.84 mg/dL (ref 0.60–0.88)
GLUCOSE: 100 mg/dL — AB (ref 65–99)
POTASSIUM: 4 mmol/L (ref 3.5–5.3)
SODIUM: 140 mmol/L (ref 135–146)

## 2015-10-08 LAB — CBC WITH DIFFERENTIAL/PLATELET
BASOS ABS: 0 {cells}/uL (ref 0–200)
Basophils Relative: 0 %
EOS PCT: 2 %
Eosinophils Absolute: 250 cells/uL (ref 15–500)
HEMATOCRIT: 46.6 % — AB (ref 35.0–45.0)
HEMOGLOBIN: 15.9 g/dL — AB (ref 11.7–15.5)
Lymphocytes Relative: 16 %
Lymphs Abs: 2000 cells/uL (ref 850–3900)
MCH: 34.4 pg — AB (ref 27.0–33.0)
MCHC: 34.1 g/dL (ref 32.0–36.0)
MCV: 100.9 fL — ABNORMAL HIGH (ref 80.0–100.0)
MPV: 10.1 fL (ref 7.5–12.5)
Monocytes Absolute: 875 cells/uL (ref 200–950)
Monocytes Relative: 7 %
NEUTROS ABS: 9375 {cells}/uL — AB (ref 1500–7800)
Neutrophils Relative %: 75 %
Platelets: 282 10*3/uL (ref 140–400)
RBC: 4.62 MIL/uL (ref 3.80–5.10)
RDW: 14.3 % (ref 11.0–15.0)
WBC: 12.5 10*3/uL — ABNORMAL HIGH (ref 3.8–10.8)

## 2015-10-08 NOTE — Assessment & Plan Note (Signed)
History of CAD status post myocardial infarction approximately 8 years ago intervention at [facility]. Her most recent 2-D echo performed 09/20/13 revealed normal LV systolic function and mild aortic stenosis. Over the last 2 weeks she's had a marked decline in her breathing status. She is markedly dyspneic but denies chest pain. We talked about whether to pursue an invasive strategy to this point on reluctant to. I am going to get over the EKG and some routine blood work. She is scheduled to see Dr. Sherene SiresWert in several weeks for pulmonary follow-up.

## 2015-10-08 NOTE — Patient Instructions (Signed)
Medication Instructions:  Your physician recommends that you continue on your current medications as directed. Please refer to the Current Medication list given to you today.   Labwork: Your physician recommends that you return for lab work TODAY. The lab can be found on the FIRST FLOOR of out building in [address]   Follow-Up: Your physician recommends that you schedule a follow-up appointment in: 4 WEEKS WITH DR Allyson SabalBERRY.   If you need a refill on your cardiac medications before your next appointment, please call your pharmacy.

## 2015-10-08 NOTE — Assessment & Plan Note (Signed)
History of mild aortic stenosis by 2-D echo performed 09/20/13. She does have a soft outflow tract murmur

## 2015-10-08 NOTE — Assessment & Plan Note (Signed)
Mrs. Heidi Morales presents today for evaluation of worsening dyspnea on exertion. She is accompanied by her husband. She does have severe oxygen dependent COPD on at least 3 L of home O2. Approximately 2 weeks ago she noticed worsening dyspnea. She denies chest pain. Her EKG today shows sinus rhythm with PVCs. Her last echo performed 2 years ago showed normal LV systolic function. She scheduled to see Dr. Sherene SiresWert in the office in several weeks for pulmonary evaluation.

## 2015-10-08 NOTE — Assessment & Plan Note (Signed)
History of hyper-lipidemia on statin therapy with recent lipid profile performed 10/07/14 revealing total cholesterol 181, LDL 99 and HDL of 53

## 2015-10-08 NOTE — Assessment & Plan Note (Signed)
History of hypertension blood pressure measured at 142/80. She is on amlodipine, by systolic, valsartan and hydrochlorothiazide. Continue current meds at current dosing

## 2015-10-08 NOTE — Progress Notes (Signed)
10/08/2015 Heidi Morales   08/26/1930  [id number]  Primary Physician ARONSON,RICHARD A, MD Primary Cardiologist: Runell GessJonathan J Berry MD Roseanne RenoFACP, FACC, FAHA, FSCAI  HPI:  Heidi Morales is an 82 year old married Caucasian female mother of 3 children whose husband is retired Therapist, sportspsychiatrist. I last saw her in the office 06/20/15. She was referred by Dr. Jacky KindleAronson for cardiovascular evaluation because of PVCs noted on a recent electrocardiogram and a newly auscultated murmur. She has been taken care of by Dr. Melburn PopperNasher her in the past. She has a history of coronary artery disease status post myocardial infarction 8 years ago and intervention [facility] with a stent. She's never had a stroke. She denies chest pain but has chronic shortness of breath from COPD. Her cardiac risk factors include hypertension and hyperlipidemia as well as remote tobacco abuse. She had a 2-D echo performed 09/20/13 revealing normal LV systolic function with mild aortic stenosis. She did complain of left scapular pain which I suspect is referred from cervical radiculopathy. This resolved with conservative measures including a heating pad and Motrin. Since I saw in July 2016 she is moving clinically stable.She does have increasing dyspnea on exertion on the backdrop of chronic dyspnea and continuous home O2 related to COPD. She did have an event monitor that showed PVCs which is not new for her. Since I saw her in the office 4 months ago she has been relatively well and has been stable up until approximately 2 weeks ago when she's noticed progressively worsening dyspnea on exertion.   Current Outpatient Prescriptions  Medication Sig Dispense Refill  . amLODipine (NORVASC) 5 MG tablet Take 5 mg by mouth daily.      Marland Kitchen. aspirin 325 MG tablet Take 325 mg by mouth daily.      Marland Kitchen. BYSTOLIC 10 MG tablet Take 1 tablet by mouth daily.  11  . calcium citrate-vitamin D (CITRACAL+D) 315-200 MG-UNIT per tablet Take 1 tablet by mouth daily.      .  Cholecalciferol (VITAMIN D PO) Take 1 tablet by mouth daily.      . hydrochlorothiazide (HYDRODIURIL) 25 MG tablet Take 25 mg by mouth daily.    . Multiple Vitamins-Minerals (ICAPS PO) Take 1 capsule by mouth 2 (two) times daily.     . OXYGEN 3lpm 24/7- advised to increased to 6lpm with exertion    . simvastatin (ZOCOR) 10 MG tablet Take 10 mg by mouth daily.    . Tiotropium Bromide-Olodaterol (STIOLTO RESPIMAT) 2.5-2.5 MCG/ACT AERS Inhale 2 puffs into the lungs daily. 1 Inhaler 11  . valsartan (DIOVAN) 320 MG tablet Take 320 mg by mouth daily.       No current facility-administered medications for this visit.     Allergies  Allergen Reactions  . Promethazine Hcl Other (See Comments)    Drop in Blood pressure    Social History   Social History  . Marital status: Married    Spouse name: N/A  . Number of children: N/A  . Years of education: N/A   Occupational History  . Not on file.   Social History Main Topics  . Smoking status: Former Smoker    Packs/day: 1.00    Years: 50.00    Types: Cigarettes    Quit date: 02/22/2006  . Smokeless tobacco: Never Used  . Alcohol use No  . Drug use: Unknown  . Sexual activity: Not on file   Other Topics Concern  . Not on file   Social History Narrative  .  No narrative on file     Review of Systems: General: negative for chills, fever, night sweats or weight changes.  Cardiovascular: negative for chest pain, dyspnea on exertion, edema, orthopnea, palpitations, paroxysmal nocturnal dyspnea or shortness of breath Dermatological: negative for rash Respiratory: negative for cough or wheezing Urologic: negative for hematuria Abdominal: negative for nausea, vomiting, diarrhea, bright red blood per rectum, melena, or hematemesis Neurologic: negative for visual changes, syncope, or dizziness All other systems reviewed and are otherwise negative except as noted above.    Blood pressure (!) 142/80, pulse 92, height 5\' 4"  (1.626 m),  weight 129 lb 12.8 oz (58.9 kg), SpO2 (!) 58 %.  General appearance: alert and no distress Neck: no adenopathy, no carotid bruit, no JVD, supple, symmetrical, trachea midline and thyroid not enlarged, symmetric, no tenderness/mass/nodules Lungs: clear to auscultation bilaterally Heart: Soft outflow tract murmur Extremities: extremities normal, atraumatic, no cyanosis or edema  EKG normal sinus rhythm at 88 with PVCs. She has right axis deviation as well with first-degree AV block. There are also septal Q waves noted. I personally reviewed this EKG  ASSESSMENT AND PLAN:   CAD (coronary artery disease) History of CAD status post myocardial infarction approximately 8 years ago intervention at [facility]. Her most recent 2-D echo performed 09/20/13 revealed normal LV systolic function and mild aortic stenosis. Over the last 2 weeks she's had a marked decline in her breathing status. She is markedly dyspneic but denies chest pain. We talked about whether to pursue an invasive strategy to this point on reluctant to. I am going to get over the EKG and some routine blood work. She is scheduled to see Dr. Sherene SiresWert in several weeks for pulmonary follow-up.  Hyperlipidemia History of hyper-lipidemia on statin therapy with recent lipid profile performed 10/07/14 revealing total cholesterol 181, LDL 99 and HDL of 53  Aortic stenosis History of mild aortic stenosis by 2-D echo performed 09/20/13. She does have a soft outflow tract murmur  Essential hypertension History of hypertension blood pressure measured at 142/80. She is on amlodipine, by systolic, valsartan and hydrochlorothiazide. Continue current meds at current dosing  Dyspnea Heidi Morales presents today for evaluation of worsening dyspnea on exertion. She is accompanied by her husband. She does have severe oxygen dependent COPD on at least 3 L of home O2. Approximately 2 weeks ago she noticed worsening dyspnea. She denies chest pain. Her EKG  today shows sinus rhythm with PVCs. Her last echo performed 2 years ago showed normal LV systolic function. She scheduled to see Dr. Sherene SiresWert in the office in several weeks for pulmonary evaluation.      Runell GessJonathan J. Berry MD FACP,FACC,FAHA, [facility]SCAI 10/08/2015 12:19 PM

## 2015-10-09 LAB — BRAIN NATRIURETIC PEPTIDE: BRAIN NATRIURETIC PEPTIDE: 1305.8 pg/mL — AB (ref <100)

## 2015-10-10 ENCOUNTER — Encounter: Payer: Self-pay | Admitting: Internal Medicine

## 2015-10-10 ENCOUNTER — Ambulatory Visit (INDEPENDENT_AMBULATORY_CARE_PROVIDER_SITE_OTHER): Payer: Medicare Other | Admitting: Internal Medicine

## 2015-10-10 ENCOUNTER — Ambulatory Visit (INDEPENDENT_AMBULATORY_CARE_PROVIDER_SITE_OTHER)
Admission: RE | Admit: 2015-10-10 | Discharge: 2015-10-10 | Disposition: A | Discharge status: Home / Self Care | Payer: Medicare Other | Source: Ambulatory Visit | Attending: Internal Medicine | Admitting: Internal Medicine

## 2015-10-10 VITALS — BP 132/66 | HR 74 | Ht 64.0 in | Wt 129.0 lb

## 2015-10-10 DIAGNOSIS — J449 Chronic obstructive pulmonary disease, unspecified: Secondary | ICD-10-CM

## 2015-10-10 DIAGNOSIS — I2510 Atherosclerotic heart disease of native coronary artery without angina pectoris: Secondary | ICD-10-CM | POA: Diagnosis not present

## 2015-10-10 DIAGNOSIS — J9611 Chronic respiratory failure with hypoxia: Secondary | ICD-10-CM | POA: Diagnosis not present

## 2015-10-10 MED ORDER — ALBUTEROL SULFATE HFA 108 (90 BASE) MCG/ACT IN AERS: INHALATION_SPRAY | RESPIRATORY_TRACT | 11 refills | Status: AC

## 2015-10-10 MED ORDER — PREDNISONE 10 MG PO TABS: ORAL_TABLET | ORAL | 0 refills | Status: DC

## 2015-10-10 MED ORDER — AZITHROMYCIN 250 MG PO TABS: ORAL_TABLET | ORAL | 0 refills | Status: DC

## 2015-10-10 NOTE — Progress Notes (Signed)
Subjective:    Patient ID: Heidi Morales, female    DOB: May 27, 1930     MRN: [id number]    Brief patient profile:  58 yowf married to a retired psychiatrist s/p smoking cessation 2008 with onset of ihd/ s/p stent and resumed nl activity including yard work, steps s any resp meds  and did fine until around 2010 dx of emphysema by CT and then noted  sob in 2012 gradually worse so referred by Dr Jacky Kindle 01/13/2015 to pulmonary clinic    History of Present Illness  01/13/2015 1st [facility] office visit/ Heidi Morales  maint rx spiriva  Chief Complaint  Patient presents with  . Pulmonary Consult    Referred by Dr. Jacky Kindle.  Pt states she has had breathing problems for the past 2 yrs. She c/o increased DOE for the past month.  She has also had non prod cough. She was out of breath walking from lobby to exam room today, and sats on RA at rest were 63%--increased to    on 02 x 2 years  But doesn't use portable system at all.  Gradual onset progressive to 50 ft flat and slow assoc with dry cough day > noct rec Stop spiriva and start stiolto 2 puffs each am  Wear 02 3lpm at rest and 6lpm with activity for now Please see patient coordinator before you leave today  to schedule portable 02 titration   03/13/2015  f/u ov/Heidi Morales re: GOLD I copd with emphysematous features  Chief Complaint  Patient presents with  . Follow-up    Pt states that her breathing is about the same. No new co's. She has only been using o2 2lpm with sleep and prn daytime.   no better on stiolto than spiriva  rec Continue stiolto 2 puffs each am  Wear 02 3lpm at rest and 6lpm with activity for now > did not do    04/24/2015  f/u ov/Heidi Morales re:  GOLD I copd / emphysematous features /02 dep at 3lpm rest/ 6lpm walking  Chief Complaint  Patient presents with  . Follow-up    Breathing is doing well. She has been using o2 3lpm 24/7- if gets winded when walking she slows down her pace.   sleeping well on 3lpm concentrator but spends  most of the day downstairs on pulsed 3lpm per POC and not increasing with activity as rec (present POC only goes to 5lpm). Able to do HT slow pace/ leaning on cart on 3lpm rec No change in pulmonary medications Ok to continue the portable concentrator at 3lpm at rest but turn it up to 5 lpm with any activity other than sitting to prevent your blood from getting thicker Please schedule a follow up visit in 3 months but call sooner if needed     07/25/2015  f/u ov/Heidi Morales re:  GOLD I copd/ stiolto and 02  Chief Complaint  Patient presents with  . Follow-up    Breathing progressively worse since the last visit. She still has never increased o2 to 5 with exertion per last ov recs.   MMRC3 = can't walk 100 yards even at a slow pace at a flat grade s stopping due to sob  rec For drainage / throat tickle try take CHLORPHENIRAMINE  4 mg - take one every 4 hours as needed - available over the counter-   Ok to continue the portable concentrator: 3lpm at rest but turn it up to 5 lpm with any activity other than sitting to prevent  your blood from getting thicker    10/10/2015  f/u ov/Heidi Morales re:  Copd gold I/ stiolto Chief Complaint  Patient presents with  . Follow-up    pt c/o worsening SOB- earlier appt requested by cardiologist, pt unsure why.    at rest and sleeping on 3lpm fine but now has to prop up on sev pillows   At best able to swynn air dyne x 10 min on 3 sats ok x 2 weeks prior to OV   Gradually worse x sev weeks on stiolto 2 pffs each am with increased dark yellow mucus      No obvious day to day or daytime variability or assoc excess/ purulent sputum or mucus plugs  or cp or chest tightness, subjective wheeze or overt sinus or hb symptoms. No unusual exp hx or h/o childhood pna/ asthma or knowledge of premature birth.  Sleeping ok without nocturnal  or early am exacerbation  of respiratory  c/o's or need for noct saba. Also denies any obvious fluctuation of symptoms with weather or  environmental changes or other aggravating or alleviating factors except as outlined above   Current Medications, Allergies, Complete Past Medical History, Past Surgical History, Family History, and Social History were reviewed in [facility] electronic medical record.  ROS  The following are not active complaints unless bolded sore throat, dysphagia, dental problems, itching, sneezing,  nasal congestion or excess/ purulent secretions, ear ache,   fever, chills, sweats, unintended wt loss, classically pleuritic or exertional cp, hemoptysis,  orthopnea pnd or leg swelling, presyncope, palpitations, abdominal pain, anorexia, nausea, vomiting, diarrhea  or change in bowel or bladder habits, change in stools or urine, dysuria,hematuria,  rash, arthralgias, visual complaints, headache, numbness, weakness or ataxia or problems with walking or coordination,  change in mood/affect or memory.                     Objective:  Physical Exam  amb frail elderly wf nad   10/10/2015        129  07/25/2015          136      03/13/2015        139   01/13/15 142 lb (64.411 kg)  09/20/14 140 lb 12.8 oz (63.866 kg)  04/30/14 145 lb 1.6 oz (65.817 kg)    Vital signs reviewed - 3lpm POC 90%   HEENT: nl dentition, turbinates, and oropharynx. Nl external ear canals without cough reflex   NECK :  without JVD/Nodes/TM/ nl carotid upstrokes bilaterally   LUNGS: no acc muscle use, slt hyperresonant to percussion/ distant bs  bilaterally without cough on insp or exp maneuvers  CV:  RRR  no s3 or murmur or increase in P2, no edema   ABD:  soft and nontender with end insp hoover's sign. No bruits or organomegaly, bowel sounds nl  MS:  warm without deformities, calf tenderness, cyanosis or clubbing  SKIN: warm and dry without lesions    NEURO:  alert, approp, no deficits   CXR PA and Lateral:   10/10/2015 :    I personally reviewed images and agree with radiology impression as follows:   No acute  abnormalities. Coarsening of lung markings in the bases may be due to scarring or chronic bronchitic change given history. There have been no acute interval changes.    Labs ordered/ reviewed:      Chemistry      Component Value Date/Time   NA 140 10/08/2015 1354  K 4.0 10/08/2015 1354   CL 105 10/08/2015 1354   CO2 22 10/08/2015 1354   BUN 22 10/08/2015 1354   CREATININE 0.84 10/08/2015 1354      Component Value Date/Time   CALCIUM 10.3 10/08/2015 1354   ALKPHOS 97 10/07/2014 0928   AST 13 10/07/2014 0928   ALT 10 10/07/2014 0928   BILITOT 0.6 10/07/2014 0928        Lab Results  Component Value Date   WBC 12.5 (H) 10/08/2015   HGB 15.9 (H) 10/08/2015   HCT 46.6 (H) 10/08/2015   MCV 100.9 (H) 10/08/2015   PLT 282 10/08/2015     BNP      10/08/15   =  1306        Assessment & Plan:

## 2015-10-10 NOTE — Patient Instructions (Addendum)
zpak and Prednisone 10 mg take  4 each am x 2 days,   2 each am x 2 days,  1 each am x 2 days and stop   Floor for 02 is 3lpm and titrate to keep above 90% with activity   Plan A = Automatic =  Stiolto 2 puffs each am   Plan B = Backup Only use your albuterol (proair) as a rescue medication to be used if you can't catch your breath by resting or doing a relaxed purse lip breathing pattern.  - The less you use it, the better it will work when you need it. - Ok to use the inhaler up to 2 puffs  every 4 hours if you must but call for appointment if use goes up over your usual need - Don't leave home without it !!  (think of it like the spare tire for your car)    Please remember to go to the  x-ray department downstairs for your tests - we will call you with the results when they are available.  Keep previous appointment - call sooner if needed

## 2015-10-12 NOTE — Assessment & Plan Note (Signed)
Sats 63% RA at rest 01/13/2015  > 88% on 3lpm   01/13/2015   Walked 6 x one lap @ 185 stopped due to desat at 1/2 lap at slow pace  -- 04/24/2015  Walked 5lpm POC pulsed  x one lap @ 185 stopped due to sob and desat to 78% "faster and further than I usually walk"    rec as of 10/10/2015 =  3lpm 24/7 and 5lpm  POC pulsed with any activity   Note hc03 falling now which is typical of chf as induces mild hyperventilation which drives the hc03 down

## 2015-10-12 NOTE — Assessment & Plan Note (Addendum)
01/13/2015     rec change to stiolto 2 each am  - PFT's  03/14/2015  FEV1 1.70 (91 % ) ratio 60  p no % improvement from saba with DLCO  21 % corrects to 22 % for alv volume  - 04/24/2015  extensive coaching HFA effectiveness =    90% with respimat     DDX of  difficult airways management almost all start with A and  include Adherence, Ace Inhibitors, Acid Reflux, Active Sinus Disease, Alpha 1 Antitripsin deficiency, Anxiety masquerading as Airways dz,  ABPA,  Allergy(esp in young), Aspiration (esp in elderly), Adverse effects of meds,  Active smokers, A bunch of PE's (a small clot burden can't cause this syndrome unless there is already severe underlying pulm or vascular dz with poor reserve) plus two Bs  = Bronchiectasis and Beta blocker use..and one C= CHF  Adherence is always the initial "prime suspect" and is a multilayered concern that requires a "trust but verify" approach in every patient - starting with knowing how to use medications, especially inhalers, correctly, keeping up with refills and understanding the fundamental difference between maintenance and prns vs those medications only taken for a very short course and then stopped and not refilled.   ? Allergy/asthma component > Prednisone 10 mg take  4 each am x 2 days,   2 each am x 2 days,  1 each am x 2 days and stop trial   ? Acute sinusitis/ tracheobronchitis > doubt though mucus is discolored > cover with zpak  ? Chf > suggested by new orthopnea and bnp elevation > w/u in progress by Dr Allyson SabalBerry    I had an extended discussion with the patient and husband  reviewing all relevant studies completed to date and  lasting 15 to 20 minutes of a 25 minute visit    Each maintenance medication was reviewed in detail including most importantly the difference between maintenance and prns and under what circumstances the prns are to be triggered using an action plan format that is not reflected in the computer generated alphabetically organized  AVS.    Please see instructions for details which were reviewed in writing and the patient given a copy highlighting the part that I personally wrote and discussed at today's ov.

## 2015-10-13 ENCOUNTER — Telehealth: Payer: Self-pay | Admitting: *Deleted

## 2015-10-13 DIAGNOSIS — R0600 Dyspnea, unspecified: Secondary | ICD-10-CM

## 2015-10-13 NOTE — Progress Notes (Signed)
LMTCB

## 2015-10-13 NOTE — Progress Notes (Signed)
ATC line busy, WCB 

## 2015-10-13 NOTE — Telephone Encounter (Signed)
-----   Message from Runell GessJonathan J Berry, MD sent at 10/10/2015  1:25 PM EDT ----- Her BNP is elevated to 1300. Platelets get a 2-D echocardiogram and have her see me back in 2-3 weeks.

## 2015-10-14 ENCOUNTER — Telehealth: Payer: Self-pay | Admitting: Internal Medicine

## 2015-10-14 NOTE — Telephone Encounter (Signed)
Patient notified.  No questions or concerns at this time. Nothing further needed.   

## 2015-10-14 NOTE — Telephone Encounter (Signed)
Dr Bunnie PionBodner [phone]

## 2015-10-14 NOTE — Telephone Encounter (Signed)
Notes Recorded by Nyoka CowdenMichael B Wert, MD on 10/10/2015 at 5:20 PM EDT Call pt: Reviewed cxr and no acute change so no change in recommendations made at ov  lmomtcb x 1 for pts husband.

## 2015-10-15 NOTE — Telephone Encounter (Signed)
Patient gave verbal OK to speak with husband about results.  Notified of labs & explained need for echo. Patient/husband agreeable to this.  Test has already been ordered, they are aware a scheduler will contact them to arrange this appointment  Patient has MD OV 9/19

## 2015-10-20 ENCOUNTER — Ambulatory Visit (INDEPENDENT_AMBULATORY_CARE_PROVIDER_SITE_OTHER): Payer: Medicare Other | Admitting: Podiatry

## 2015-10-20 ENCOUNTER — Encounter: Payer: Self-pay | Admitting: Podiatry

## 2015-10-20 DIAGNOSIS — M79676 Pain in unspecified toe(s): Secondary | ICD-10-CM

## 2015-10-20 DIAGNOSIS — B351 Tinea unguium: Secondary | ICD-10-CM

## 2015-10-20 NOTE — Progress Notes (Signed)
Patient ID: Heidi Morales, female   DOB: 08/11/1930, 82 y.o.   MRN: [id number]  Subjective: 82 y.o. returns the office today for painful, elongated, thickened toenails which she cannot trim herself. Denies any redness or drainage around the nails. Denies any acute changes since last appointment and no new complaints today. Denies any systemic complaints such as fevers, chills, nausea, vomiting.   Objective: AAO 3, NAD DP/PT pulses palpable, CRT less than 3 seconds Nails hypertrophic, dystrophic, elongated, brittle, discolored 10. There is tenderness overlying the nails 1-5 bilaterally. There is no surrounding erythema or drainage along the nail sites. No open lesions or pre-ulcerative lesions are identified. No other areas of tenderness bilateral lower extremities. No overlying edema, erythema, increased warmth. No pain with calf compression, swelling, warmth, erythema.  Assessment: Patient presents with symptomatic onychomycosis  Plan: -Treatment options including alternatives, risks, complications were discussed -Nails sharply debrided 10 without complication/bleeding. -Discussed daily foot inspection. If there are any changes, to call the office immediately.  -Follow-up in 3 months or sooner if any problems are to arise. In the meantime, encouraged to call the office with any questions, concerns, changes symptoms.  Ovid CurdMatthew Knolan Simien, DPM

## 2015-10-24 DIAGNOSIS — J9620 Acute and chronic respiratory failure, unspecified whether with hypoxia or hypercapnia: Secondary | ICD-10-CM

## 2015-10-24 HISTORY — DX: Acute and chronic respiratory failure, unspecified whether with hypoxia or hypercapnia: J96.20

## 2015-10-28 ENCOUNTER — Inpatient Hospital Stay (HOSPITAL_COMMUNITY): Payer: Medicare Other

## 2015-10-28 ENCOUNTER — Inpatient Hospital Stay (HOSPITAL_COMMUNITY)
Admission: EM | Admit: 2015-10-28 | Discharge: 2015-10-30 | DRG: 189 | Disposition: A | Discharge status: Hospice | Payer: Medicare Other | Attending: Internal Medicine | Admitting: Internal Medicine

## 2015-10-28 ENCOUNTER — Encounter (HOSPITAL_COMMUNITY): Payer: Self-pay

## 2015-10-28 ENCOUNTER — Other Ambulatory Visit: Payer: Self-pay

## 2015-10-28 ENCOUNTER — Ambulatory Visit (HOSPITAL_BASED_OUTPATIENT_CLINIC_OR_DEPARTMENT_OTHER): Payer: Medicare Other

## 2015-10-28 ENCOUNTER — Encounter (HOSPITAL_COMMUNITY): Payer: Self-pay | Admitting: *Deleted

## 2015-10-28 ENCOUNTER — Emergency Department (HOSPITAL_COMMUNITY): Payer: Medicare Other

## 2015-10-28 DIAGNOSIS — E785 Hyperlipidemia, unspecified: Secondary | ICD-10-CM | POA: Diagnosis present

## 2015-10-28 DIAGNOSIS — I350 Nonrheumatic aortic (valve) stenosis: Secondary | ICD-10-CM

## 2015-10-28 DIAGNOSIS — I313 Pericardial effusion (noninflammatory): Secondary | ICD-10-CM | POA: Insufficient documentation

## 2015-10-28 DIAGNOSIS — Z7982 Long term (current) use of aspirin: Secondary | ICD-10-CM

## 2015-10-28 DIAGNOSIS — I700 Atherosclerosis of aorta: Secondary | ICD-10-CM | POA: Diagnosis present

## 2015-10-28 DIAGNOSIS — R911 Solitary pulmonary nodule: Secondary | ICD-10-CM | POA: Diagnosis present

## 2015-10-28 DIAGNOSIS — I110 Hypertensive heart disease with heart failure: Secondary | ICD-10-CM | POA: Diagnosis present

## 2015-10-28 DIAGNOSIS — Z9981 Dependence on supplemental oxygen: Secondary | ICD-10-CM | POA: Diagnosis not present

## 2015-10-28 DIAGNOSIS — J9601 Acute respiratory failure with hypoxia: Secondary | ICD-10-CM | POA: Diagnosis not present

## 2015-10-28 DIAGNOSIS — R000 Tachycardia, unspecified: Secondary | ICD-10-CM | POA: Diagnosis present

## 2015-10-28 DIAGNOSIS — Z96651 Presence of right artificial knee joint: Secondary | ICD-10-CM | POA: Diagnosis present

## 2015-10-28 DIAGNOSIS — R008 Other abnormalities of heart beat: Secondary | ICD-10-CM | POA: Diagnosis not present

## 2015-10-28 DIAGNOSIS — M1990 Unspecified osteoarthritis, unspecified site: Secondary | ICD-10-CM | POA: Diagnosis present

## 2015-10-28 DIAGNOSIS — I119 Hypertensive heart disease without heart failure: Secondary | ICD-10-CM

## 2015-10-28 DIAGNOSIS — Z87891 Personal history of nicotine dependence: Secondary | ICD-10-CM

## 2015-10-28 DIAGNOSIS — J9621 Acute and chronic respiratory failure with hypoxia: Secondary | ICD-10-CM | POA: Diagnosis present

## 2015-10-28 DIAGNOSIS — R630 Anorexia: Secondary | ICD-10-CM | POA: Diagnosis present

## 2015-10-28 DIAGNOSIS — I272 Other secondary pulmonary hypertension: Secondary | ICD-10-CM | POA: Diagnosis present

## 2015-10-28 DIAGNOSIS — R0602 Shortness of breath: Secondary | ICD-10-CM | POA: Diagnosis present

## 2015-10-28 DIAGNOSIS — Z515 Encounter for palliative care: Secondary | ICD-10-CM

## 2015-10-28 DIAGNOSIS — E873 Alkalosis: Secondary | ICD-10-CM | POA: Diagnosis present

## 2015-10-28 DIAGNOSIS — I509 Heart failure, unspecified: Secondary | ICD-10-CM | POA: Diagnosis not present

## 2015-10-28 DIAGNOSIS — J449 Chronic obstructive pulmonary disease, unspecified: Secondary | ICD-10-CM | POA: Diagnosis present

## 2015-10-28 DIAGNOSIS — Z79899 Other long term (current) drug therapy: Secondary | ICD-10-CM

## 2015-10-28 DIAGNOSIS — I493 Ventricular premature depolarization: Secondary | ICD-10-CM | POA: Diagnosis present

## 2015-10-28 DIAGNOSIS — I071 Rheumatic tricuspid insufficiency: Secondary | ICD-10-CM | POA: Insufficient documentation

## 2015-10-28 DIAGNOSIS — Z7189 Other specified counseling: Secondary | ICD-10-CM

## 2015-10-28 DIAGNOSIS — R0600 Dyspnea, unspecified: Secondary | ICD-10-CM

## 2015-10-28 DIAGNOSIS — J9622 Acute and chronic respiratory failure with hypercapnia: Secondary | ICD-10-CM | POA: Diagnosis present

## 2015-10-28 DIAGNOSIS — I2510 Atherosclerotic heart disease of native coronary artery without angina pectoris: Secondary | ICD-10-CM | POA: Diagnosis not present

## 2015-10-28 DIAGNOSIS — I5033 Acute on chronic diastolic (congestive) heart failure: Secondary | ICD-10-CM | POA: Diagnosis present

## 2015-10-28 DIAGNOSIS — I10 Essential (primary) hypertension: Secondary | ICD-10-CM

## 2015-10-28 DIAGNOSIS — Z8249 Family history of ischemic heart disease and other diseases of the circulatory system: Secondary | ICD-10-CM

## 2015-10-28 DIAGNOSIS — Z66 Do not resuscitate: Secondary | ICD-10-CM | POA: Diagnosis present

## 2015-10-28 DIAGNOSIS — I252 Old myocardial infarction: Secondary | ICD-10-CM

## 2015-10-28 DIAGNOSIS — I059 Rheumatic mitral valve disease, unspecified: Secondary | ICD-10-CM | POA: Insufficient documentation

## 2015-10-28 DIAGNOSIS — I2781 Cor pulmonale (chronic): Secondary | ICD-10-CM | POA: Diagnosis present

## 2015-10-28 DIAGNOSIS — J9600 Acute respiratory failure, unspecified whether with hypoxia or hypercapnia: Secondary | ICD-10-CM | POA: Diagnosis present

## 2015-10-28 DIAGNOSIS — I2609 Other pulmonary embolism with acute cor pulmonale: Secondary | ICD-10-CM | POA: Diagnosis not present

## 2015-10-28 DIAGNOSIS — Z90710 Acquired absence of both cervix and uterus: Secondary | ICD-10-CM

## 2015-10-28 HISTORY — DX: Acute and chronic respiratory failure, unspecified whether with hypoxia or hypercapnia: J96.20

## 2015-10-28 LAB — BASIC METABOLIC PANEL
Anion gap: 11 (ref 5–15)
BUN: 20 mg/dL (ref 6–20)
CHLORIDE: 101 mmol/L (ref 101–111)
CO2: 23 mmol/L (ref 22–32)
Calcium: 10.6 mg/dL — ABNORMAL HIGH (ref 8.9–10.3)
Creatinine, Ser: 0.93 mg/dL (ref 0.44–1.00)
GFR calc Af Amer: >60 mL/min (ref >60)
GFR calc non Af Amer: 55 mL/min — ABNORMAL LOW (ref >60)
GLUCOSE: 107 mg/dL — AB (ref 65–99)
POTASSIUM: 3.6 mmol/L (ref 3.5–5.1)
Sodium: 135 mmol/L (ref 135–145)

## 2015-10-28 LAB — CBC WITH DIFFERENTIAL/PLATELET
Basophils Absolute: 0.1 10*3/uL (ref 0.0–0.1)
Basophils Relative: 0 %
EOS PCT: 2 %
Eosinophils Absolute: 0.2 10*3/uL (ref 0.0–0.7)
HEMATOCRIT: 46.6 % — AB (ref 36.0–46.0)
Hemoglobin: 16.2 g/dL — ABNORMAL HIGH (ref 12.0–15.0)
LYMPHS ABS: 2.2 10*3/uL (ref 0.7–4.0)
LYMPHS PCT: 17 %
MCH: 33.9 pg (ref 26.0–34.0)
MCHC: 34.8 g/dL (ref 30.0–36.0)
MCV: 97.5 fL (ref 78.0–100.0)
MONO ABS: 0.8 10*3/uL (ref 0.1–1.0)
Monocytes Relative: 6 %
Neutro Abs: 9.6 10*3/uL — ABNORMAL HIGH (ref 1.7–7.7)
Neutrophils Relative %: 75 %
PLATELETS: 243 10*3/uL (ref 150–400)
RBC: 4.78 MIL/uL (ref 3.87–5.11)
RDW: 13.2 % (ref 11.5–15.5)
WBC: 12.7 10*3/uL — ABNORMAL HIGH (ref 4.0–10.5)

## 2015-10-28 LAB — I-STAT TROPONIN, ED: Troponin i, poc: 0.02 ng/mL (ref 0.00–0.08)

## 2015-10-28 LAB — PROTIME-INR
INR: 1.02
PROTHROMBIN TIME: 13.5 s (ref 11.4–15.2)

## 2015-10-28 LAB — TSH: TSH: 1.475 u[IU]/mL (ref 0.350–4.500)

## 2015-10-28 LAB — I-STAT ARTERIAL BLOOD GAS, ED
Acid-base deficit: 3 mmol/L — ABNORMAL HIGH (ref 0.0–2.0)
BICARBONATE: 18.7 mmol/L — AB (ref 20.0–28.0)
O2 Saturation: 88 %
PH ART: 7.484 — AB (ref 7.350–7.450)
TCO2: 19 mmol/L (ref 0–100)
pCO2 arterial: 24.7 mmHg — ABNORMAL LOW (ref 32.0–48.0)
pO2, Arterial: 47 mmHg — ABNORMAL LOW (ref 83.0–108.0)

## 2015-10-28 LAB — BRAIN NATRIURETIC PEPTIDE: B Natriuretic Peptide: 1267.9 pg/mL — ABNORMAL HIGH (ref 0.0–100.0)

## 2015-10-28 LAB — C-REACTIVE PROTEIN: CRP: <0.5 mg/dL (ref <1.0)

## 2015-10-28 LAB — APTT: APTT: 32 s (ref 24–36)

## 2015-10-28 LAB — MAGNESIUM: MAGNESIUM: 1.7 mg/dL (ref 1.7–2.4)

## 2015-10-28 LAB — SEDIMENTATION RATE: Sed Rate: 2 mm/hr (ref 0–22)

## 2015-10-28 LAB — D-DIMER, QUANTITATIVE: D-Dimer, Quant: 0.61 ug/mL-FEU — ABNORMAL HIGH (ref 0.00–0.50)

## 2015-10-28 MED ORDER — SODIUM CHLORIDE 0.9 % IV BOLUS (SEPSIS): 1000.0000 mL | INTRAVENOUS | Status: DC | PRN

## 2015-10-28 MED ORDER — HEPARIN (PORCINE) IN NACL 100-0.45 UNIT/ML-% IJ SOLN
900.0000 [IU]/h | INTRAMUSCULAR | Status: DC
  Administered 2015-10-28: 900 [IU]/h via INTRAVENOUS
  Filled 2015-10-28: qty 250

## 2015-10-28 MED ORDER — IOPAMIDOL (ISOVUE-370) INJECTION 76%
INTRAVENOUS | Status: AC
  Administered 2015-10-28: 100 mL
  Filled 2015-10-28: qty 100

## 2015-10-28 MED ORDER — SODIUM CHLORIDE 0.9 % IV SOLN
INTRAVENOUS | Status: DC
  Administered 2015-10-28: 1000 mL via INTRAVENOUS

## 2015-10-28 MED ORDER — HEPARIN BOLUS VIA INFUSION
3500.0000 [IU] | Freq: Once | INTRAVENOUS | Status: AC
  Administered 2015-10-28: 3500 [IU] via INTRAVENOUS
  Filled 2015-10-28: qty 3500

## 2015-10-28 MED ORDER — NEBIVOLOL HCL 10 MG PO TABS
10.0000 mg | ORAL_TABLET | Freq: Every day | ORAL | Status: DC
  Administered 2015-10-29: 10 mg via ORAL
  Filled 2015-10-28 (×2): qty 1

## 2015-10-28 MED ORDER — IPRATROPIUM-ALBUTEROL 0.5-2.5 (3) MG/3ML IN SOLN
3.0000 mL | Freq: Four times a day (QID) | RESPIRATORY_TRACT | Status: DC
  Administered 2015-10-29 (×4): 3 mL via RESPIRATORY_TRACT
  Filled 2015-10-28 (×5): qty 3

## 2015-10-28 MED ORDER — METHYLPREDNISOLONE SODIUM SUCC 125 MG IJ SOLR
60.0000 mg | Freq: Three times a day (TID) | INTRAMUSCULAR | Status: DC
  Administered 2015-10-28 – 2015-10-29 (×3): 60 mg via INTRAVENOUS
  Filled 2015-10-28 (×3): qty 2

## 2015-10-28 MED ORDER — HYDROCODONE-ACETAMINOPHEN 5-325 MG PO TABS: 1.0000 | ORAL_TABLET | Freq: Four times a day (QID) | ORAL | Status: DC | PRN

## 2015-10-28 MED ORDER — MOMETASONE FURO-FORMOTEROL FUM 200-5 MCG/ACT IN AERO
2.0000 | INHALATION_SPRAY | Freq: Two times a day (BID) | RESPIRATORY_TRACT | Status: DC
  Administered 2015-10-29 – 2015-10-30 (×3): 2 via RESPIRATORY_TRACT
  Filled 2015-10-28: qty 8.8

## 2015-10-28 MED ORDER — SODIUM CHLORIDE 0.9% FLUSH
3.0000 mL | Freq: Two times a day (BID) | INTRAVENOUS | Status: DC
  Administered 2015-10-28 – 2015-10-30 (×3): 3 mL via INTRAVENOUS

## 2015-10-28 MED ORDER — IPRATROPIUM-ALBUTEROL 0.5-2.5 (3) MG/3ML IN SOLN
3.0000 mL | Freq: Once | RESPIRATORY_TRACT | Status: AC
  Administered 2015-10-28: 3 mL via RESPIRATORY_TRACT
  Filled 2015-10-28: qty 3

## 2015-10-28 MED ORDER — BISACODYL 10 MG RE SUPP
10.0000 mg | Freq: Every day | RECTAL | Status: DC | PRN
  Administered 2015-10-30: 10 mg via RECTAL

## 2015-10-28 MED ORDER — ONDANSETRON HCL 4 MG PO TABS: 4.0000 mg | ORAL_TABLET | Freq: Four times a day (QID) | ORAL | Status: DC | PRN

## 2015-10-28 MED ORDER — ASPIRIN 325 MG PO TABS
325.0000 mg | ORAL_TABLET | Freq: Every day | ORAL | Status: DC
  Administered 2015-10-29 – 2015-10-30 (×2): 325 mg via ORAL
  Filled 2015-10-28 (×2): qty 1

## 2015-10-28 MED ORDER — ONDANSETRON HCL 4 MG/2ML IJ SOLN: 4.0000 mg | Freq: Four times a day (QID) | INTRAMUSCULAR | Status: DC | PRN

## 2015-10-28 MED ORDER — AMLODIPINE BESYLATE 5 MG PO TABS
5.0000 mg | ORAL_TABLET | Freq: Every day | ORAL | Status: DC
  Administered 2015-10-29 – 2015-10-30 (×2): 5 mg via ORAL
  Filled 2015-10-28 (×3): qty 1

## 2015-10-28 MED ORDER — IRBESARTAN 75 MG PO TABS
75.0000 mg | ORAL_TABLET | Freq: Every day | ORAL | Status: DC
  Administered 2015-10-29: 75 mg via ORAL
  Filled 2015-10-28 (×2): qty 1

## 2015-10-28 MED ORDER — ALBUTEROL SULFATE (2.5 MG/3ML) 0.083% IN NEBU: 2.5000 mg | INHALATION_SOLUTION | RESPIRATORY_TRACT | Status: DC | PRN

## 2015-10-28 NOTE — ED Notes (Signed)
Report called and given to [facility]

## 2015-10-28 NOTE — Consult Note (Addendum)
Name: Heidi Morales MRN: [id number] DOB: December 18, 1930    ADMISSION DATE:  10/28/2015 CONSULTATION DATE:  10/28/2015  REFERRING MD :  Dr. Thedore Mins  CHIEF COMPLAINT:  SOB/hypoxia  HISTORY OF PRESENT ILLNESS:  82 year old female with PMH as below, which is significant for end stage COPD (followed by Dr. Sherene Sires, on O2 3lpm), CAD, and HLD. She has had a progressive worsening of her SOB over the last year which has now progressed to the point she is unable to walk 10 feet. She was seen in the pulmonary clinic by Dr. Sherene Sires 8/18 who elicited that she has been needing to sleep with more and more pillows over that time period and added a prednisone taper in case there was any allergy/asthma component. He felt a large portion of this was due to CHF which is in the process of being worked up by Dr. Allyson Sabal. As part of that workup 9/5 she presented for echo and was found to have severe pulmonary hypertension with PA pressure 120 and she was sent to ED where she required 10L O2 via [place] to keep sat's up. She is admitted to the hospitalitis for pulmonary HTN, they also plan to rule out PE. PCCM to consult.  SIGNIFICANT EVENTS    STUDIES:  PFT's 1/20 > FEV1 1.70 (91 % ) ratio 60. No improvement from saba with DLCO  21 % corrects to 22 % for alv volume    PAST MEDICAL HISTORY :   has a past medical history of Acute posthemorrhagic anemia; Aortic stenosis; Arthritis; COPD (chronic obstructive pulmonary disease) (HCC); Dyspnea; Emphysema; H/O: hysterectomy; Heart attack (HCC) (03/07/2006); Heart disease; Hyperlipidemia; Hypertension; Hypokalemia; Hyponatremia; Ischemic heart disease; Leukocytosis; and PVC's (premature ventricular contractions).  has a past surgical history that includes Total knee arthroplasty (2001); Bunionectomy (1982); and Varicose vein surgery. Prior to Admission medications   Medication Sig Start Date End Date Taking? Authorizing Provider  albuterol (PROAIR HFA) 108 (90 Base) MCG/ACT inhaler 2 pffs  every 4 hours as needed Patient taking differently: Inhale 2 puffs into the lungs every 4 (four) hours as needed for wheezing or shortness of breath.  10/10/15  Yes Nyoka Cowden, MD  amLODipine (NORVASC) 5 MG tablet Take 5 mg by mouth daily.     Yes Historical Provider, MD  aspirin 325 MG tablet Take 325 mg by mouth daily.     Yes Historical Provider, MD  BYSTOLIC 10 MG tablet Take 10 mg by mouth daily.  04/05/14  Yes Historical Provider, MD  calcium citrate-vitamin D (CITRACAL+D) 315-200 MG-UNIT per tablet Take 1 tablet by mouth daily.     Yes Historical Provider, MD  Cholecalciferol (VITAMIN D PO) Take 1 tablet by mouth daily.     Yes Historical Provider, MD  hydrochlorothiazide (HYDRODIURIL) 25 MG tablet Take 25 mg by mouth every other day.    Yes Historical Provider, MD  Multiple Vitamins-Minerals (ICAPS PO) Take 1 capsule by mouth 2 (two) times daily.    Yes Historical Provider, MD  OXYGEN Inhale 3-4 L into the lungs continuous. 3 L 24/7- advised to increase to 4 L during exertion   Yes Historical Provider, MD  Tiotropium Bromide-Olodaterol (STIOLTO RESPIMAT) 2.5-2.5 MCG/ACT AERS Inhale 2 puffs into the lungs daily. 01/13/15  Yes Nyoka Cowden, MD  valsartan (DIOVAN) 320 MG tablet Take 320 mg by mouth daily.     Yes Historical Provider, MD   Allergies  Allergen Reactions  . Promethazine Hcl Other (See Comments)  Drop in Blood pressure    FAMILY HISTORY:  family history includes Arthritis in her father; Cancer in her sister and sister; Heart disease in her mother; Heart disease (age of onset: 5950) in her sister; Hypertension in her father, mother, and sister. SOCIAL HISTORY:  reports that she quit smoking about 9 years ago. Her smoking use included Cigarettes. She has a 50.00 pack-year smoking history. She has never used smokeless tobacco. She reports that she does not drink alcohol.  REVIEW OF SYSTEMS:   Unable due to work of breathing  SUBJECTIVE:   VITAL SIGNS: Temp:  [97.5 F  (36.4 C)] 97.5 F (36.4 C) (09/05 1558) Pulse Rate:  [44-110] 110 (09/05 1808) Resp:  [20-28] 26 (09/05 1808) BP: (129-175)/(96-99) 175/96 (09/05 1808) SpO2:  [74 %-92 %] 86 % (09/05 1808) Weight:  [56.7 kg (125 lb)] 56.7 kg (125 lb) (09/05 1559)  PHYSICAL EXAMINATION: General:  Elderly female in respiratory distress Neuro:  Alert, oriented, non-focal HEENT:  [place]/AT, PERRL, no JVD Cardiovascular:  RRR, S1, Split S2, no MRG Lungs:  Wheezing throughout, crackles. Moderate distress.  Abdomen: Soft, non-tender, non-distended Musculoskeletal:  No acute deformity, no edema Skin:  Grossly intact   Recent Labs Lab 10/28/15 1630  NA 135  K 3.6  CL 101  CO2 23  BUN 20  CREATININE 0.93  GLUCOSE 107*    Recent Labs Lab 10/28/15 1630  HGB 16.2*  HCT 46.6*  WBC 12.7*  PLT 243   Dg Chest Port 1 View  Result Date: 10/28/2015 CLINICAL DATA:  Chest pain short of breath. EXAM: PORTABLE CHEST 1 VIEW COMPARISON:  10/10/2015 FINDINGS: Normal cardiac silhouette. Lungs are mildly hyperinflated. There is mild chronic bronchitic markings at the bases. Emphysematous change in the upper lobes. Atherosclerotic calcification of the aorta. IMPRESSION: Upper lobe emphysema.  No acute findings. Atherosclerotic calcification of the aorta. Electronically Signed   By: Genevive BiStewart  Edmunds M.D.   On: 10/28/2015 17:08    ASSESSMENT / PLAN:  82 year old female with PMH of COPD that is O2 dependent and has been deteriorating for the past 3 months.  Patient went to see Dr. Sherene SiresWert who gave her a prednisone taper and recommended she sees cards.  Patient went to see cardiology and an echo was done that revealed a PAP of 121 and grade one diastolic dysfunction with EF of 55-60%.  Patient was told to come to the hospital.  Patient per husband has been hypoxic to the 50's at home but husband did not trust the sat monitor.  In the hospital she is severe hypoxemic.    Hypoxemic respiratory failure: likely due to severe  pulmonary HTN, ? Intrapulmonary shunting.  - Titrate O2 for sats of 88-92%.  - Spoke with husband, DNR status.  Pulmonary HTN: at that level it is multi-factorial.  - CTA now and ?intrapulmonary shunting.  - R/O PE via CTA.  - Will need a right heart cath and involvement of the heart failure team.  - Would not start any vascular dilator at this time until cards evaluates.  - Supplemental O2.  - Autoimmune panel.  Right heart failure:  - Gentle diureses if any.  - Cardiology consult called.  COPD:  - Dulera.  - Spiriva.  - Albuterol.  - Solumedrol.  Alyson ReedyWesam G. Yacoub, M.D. [facility]. Pager: [phone]. After hours pager: [phone].  10/28/2015, 6:09 PM

## 2015-10-28 NOTE — ED Triage Notes (Signed)
Pt noted to be in bigeminy in triage. Pt spO2 in the 70's on 3L pt placed on 6L increased to 88. Pt was seen at her cardiologist and told to have an echo and then was sent here.

## 2015-10-28 NOTE — Progress Notes (Signed)
ABG obtained on patient on 6L nasal cannula.  Results given to MD.   Placed patient on 10L high flow nasal cannula.  Patient states that she feels comfortable.  Currently tolerating well.  Will continue to monitor.    Ref. Range 10/28/2015 17:09  Sample type Unknown ARTERIAL  pH, Arterial Latest Ref Range: 7.350 - 7.450  7.484 (H)  pCO2 arterial Latest Ref Range: 32.0 - 48.0 mmHg 24.7 (L)  pO2, Arterial Latest Ref Range: 83.0 - 108.0 mmHg 47.0 (L)  TCO2 Latest Ref Range: 0 - 100 mmol/L 19  Acid-base deficit Latest Ref Range: 0.0 - 2.0 mmol/L 3.0 (H)  Bicarbonate Latest Ref Range: 20.0 - 28.0 mmol/L 18.7 (L)  O2 Saturation Latest Units: % 88.0  Patient temperature Unknown 97.5 F  Collection site Unknown RADIAL, ALLEN'S T.Marland Kitchen..Marland Kitchen

## 2015-10-28 NOTE — Progress Notes (Signed)
Patient admitted to 2c12 with BiPAP in place. Oriented to staff and unit. Husband at bedside and had several questions and agreed to speak with MD's in the morning for updates. Admission RN called to complete admission data. Call bell within reach. Will continue to monitor and endorse.

## 2015-10-28 NOTE — Progress Notes (Signed)
RT placed patient on BIPAP per MD order. Patient placed on 14/7 and 70%. Patient tolerating well at this time.

## 2015-10-28 NOTE — ED Provider Notes (Signed)
[facility]-EMERGENCY DEPT Provider Note   CSN: [id number] Arrival date & time: 10/28/15  1520     History   Chief Complaint Chief Complaint  Patient presents with  . Shortness of Breath    HPI Heidi ABASCAL is a 82 y.o. female.  The history is provided by the patient and the spouse.  Shortness of Breath  This is a recurrent problem. The average episode lasts 5 weeks. The problem occurs continuously.The current episode started more than 1 week ago. The problem has been gradually worsening. Pertinent negatives include no fever, no cough, no sputum production, no chest pain and no leg swelling. It is unknown what precipitated the problem. She has tried beta-agonist inhalers and oral steroids for the symptoms. She has had no prior hospitalizations. She has had no prior ED visits. Associated medical issues include COPD and chronic lung disease.    Past Medical History:  Diagnosis Date  . Acute posthemorrhagic anemia   . Aortic stenosis   . Arthritis    End-stage arthritis of the left knee  . COPD (chronic obstructive pulmonary disease) (HCC)    COPD  . Dyspnea   . Emphysema   . H/O: hysterectomy    at age 75  . Heart attack (HCC) 03/07/2006   stent, [place]  . Heart disease   . Hyperlipidemia   . Hypertension   . Hypokalemia    postoperative  . Hyponatremia    postoperative  . Ischemic heart disease   . Leukocytosis   . PVC's (premature ventricular contractions)     Patient Active Problem List   Diagnosis Date Noted  . COPD GOLD I with predominant emphysema/ chronic 02 dep  01/13/2015  . Dyspnea 01/13/2015  . Chronic respiratory failure with hypoxia (HCC) 01/13/2015  . Aortic stenosis 09/12/2013  . Essential hypertension 09/12/2013  . Abnormal EKG 09/12/2013  . Nonexudative age-related macular degeneration 06/08/2012  . History of surgical procedure 06/08/2012  . Angioid streak 10/29/2011  . Error, refractive, myopia 10/29/2011  . Degeneration macular 04/09/2011    . CAD (coronary artery disease) 09/08/2010  . Hyperlipidemia 09/08/2010    Past Surgical History:  Procedure Laterality Date  . BUNIONECTOMY  [id number]  . TOTAL KNEE ARTHROPLASTY  2001   right knee  . VARICOSE VEIN SURGERY     at age 75--Bilateral vein stripping    OB History    No data available       Home Medications    Prior to Admission medications   Medication Sig Start Date End Date Taking? Authorizing Provider  albuterol [facility] HFA) 108 (90 Base) MCG/ACT inhaler 2 pffs every 4 hours as needed 10/10/15   Nyoka Cowden, MD  amLODipine (NORVASC) 5 MG tablet Take 5 mg by mouth daily.      Historical Provider, MD  aspirin 325 MG tablet Take 325 mg by mouth daily.      Historical Provider, MD  azithromycin (ZITHROMAX) 250 MG tablet Take 2 on day one then 1 daily x 4 days 10/10/15   Nyoka Cowden, MD  BYSTOLIC 10 MG tablet Take 1 tablet by mouth daily. 04/05/14   Historical Provider, MD  calcium citrate-vitamin D (CITRACAL+D) 315-200 MG-UNIT per tablet Take 1 tablet by mouth daily.      Historical Provider, MD  Cholecalciferol (VITAMIN D PO) Take 1 tablet by mouth daily.      Historical Provider, MD  hydrochlorothiazide (HYDRODIURIL) 25 MG tablet Take 25 mg by mouth daily.    Historical  Provider, MD  Multiple Vitamins-Minerals (ICAPS PO) Take 1 capsule by mouth 2 (two) times daily.     Historical Provider, MD  OXYGEN 3lpm 24/7- advised to increased to 6lpm with exertion    Historical Provider, MD  predniSONE (DELTASONE) 10 MG tablet Take  4 each am x 2 days,   2 each am x 2 days,  1 each am x 2 days and stop 10/10/15   Nyoka CowdenMichael B Wert, MD  simvastatin (ZOCOR) 10 MG tablet Take 10 mg by mouth daily.    Historical Provider, MD  Tiotropium Bromide-Olodaterol (STIOLTO RESPIMAT) 2.5-2.5 MCG/ACT AERS Inhale 2 puffs into the lungs daily. 01/13/15   Nyoka CowdenMichael B Wert, MD  valsartan (DIOVAN) 320 MG tablet Take 320 mg by mouth daily.      Historical Provider, MD    Family History Family History   Problem Relation Age of Onset  . Heart disease Mother     myocardial infarction  . Hypertension Mother   . Hypertension Father   . Arthritis Father   . Heart disease Sister 7950    myocardial infarction  . Hypertension Sister   . Cancer Sister     bone  . Cancer Sister     bone    Social History Social History  Substance Use Topics  . Smoking status: Former Smoker    Packs/day: 1.00    Years: 50.00    Types: Cigarettes    Quit date: 02/22/2006  . Smokeless tobacco: Never Used  . Alcohol use No     Allergies   Promethazine hcl   Review of Systems Review of Systems  Constitutional: Negative for fever.  Respiratory: Positive for shortness of breath. Negative for cough and sputum production.   Cardiovascular: Negative for chest pain and leg swelling.  All other systems reviewed and are negative.    Physical Exam Updated Vital Signs BP 129/99 (BP Location: Right Arm)   Pulse (!) 44   Temp 97.5 F (36.4 C) (Oral)   Resp 20   Ht 5\' 4"  (1.626 m)   Wt 125 lb (56.7 kg)   SpO2 (!) 89%   BMI 21.46 kg/m   Physical Exam  Constitutional: She is oriented to person, place, and time. She appears well-developed and well-nourished. No distress.  HENT:  Head: Normocephalic.  Eyes: Conjunctivae are normal.  Neck: Neck supple. No tracheal deviation present.  Cardiovascular: Normal rate, regular rhythm and normal heart sounds.   Pulmonary/Chest: Accessory muscle usage present. Tachypnea noted. No respiratory distress. She has wheezes (faint bilateral).  Abdominal: Soft. She exhibits no distension.  Neurological: She is alert and oriented to person, place, and time.  Skin: Skin is warm and dry.  Psychiatric: She has a normal mood and affect.  Vitals reviewed.    ED Treatments / Results  Labs (all labs ordered are listed, but only abnormal results are displayed) Labs Reviewed  BASIC METABOLIC PANEL - Abnormal; Notable for the following:       Result Value   Glucose,  Bld 107 (*)    Calcium 10.6 (*)    GFR calc non Af Amer 55 (*)    All other components within normal limits  CBC WITH DIFFERENTIAL/PLATELET - Abnormal; Notable for the following:    WBC 12.7 (*)    Hemoglobin 16.2 (*)    HCT 46.6 (*)    Neutro Abs 9.6 (*)    All other components within normal limits  BRAIN NATRIURETIC PEPTIDE - Abnormal; Notable for the following:  B Natriuretic Peptide 1,267.9 (*)    All other components within normal limits  I-STAT ARTERIAL BLOOD GAS, ED - Abnormal; Notable for the following:    pH, Arterial 7.484 (*)    pCO2 arterial 24.7 (*)    pO2, Arterial 47.0 (*)    Bicarbonate 18.7 (*)    Acid-base deficit 3.0 (*)    All other components within normal limits  MAGNESIUM  TSH  APTT  PROTIME-INR  D-DIMER, QUANTITATIVE (NOT AT [facility])  HEPARIN LEVEL (UNFRACTIONATED)  CBC  I-STAT TROPOININ, ED    EKG   EKG Interpretation  Date/Time: 10/28/2015 16:54:28    Ventricular Rate: 93   PR Interval: 244    QRS Duration: 89   QT Interval: 379    QTC Calculation: 459   R Axis: 136    Text Interpretation: Sinus rhythm, multiple PVCs, prolonged PR interval, LAE consider biatrial enlargement, lateral infarct, old; anteroseptal infarct, age indeterminate; interpretation limited by artifact          Radiology Dg Chest Port 1 View  Result Date: 10/28/2015 CLINICAL DATA:  Chest pain short of breath. EXAM: PORTABLE CHEST 1 VIEW COMPARISON:  10/10/2015 FINDINGS: Normal cardiac silhouette. Lungs are mildly hyperinflated. There is mild chronic bronchitic markings at the bases. Emphysematous change in the upper lobes. Atherosclerotic calcification of the aorta. IMPRESSION: Upper lobe emphysema.  No acute findings. Atherosclerotic calcification of the aorta. Electronically Signed   By: Genevive Bi M.D.   On: 10/28/2015 17:08    Procedures Procedures (including critical care time)  CRITICAL CARE Performed by: Lyndal Pulley Total critical care time: 30  minutes Critical care time was exclusive of separately billable procedures and treating other patients. Critical care was necessary to treat or prevent imminent or life-threatening deterioration. Critical care was time spent personally by me on the following activities: development of treatment plan with patient and/or surrogate as well as nursing, discussions with consultants, evaluation of patient's response to treatment, examination of patient, obtaining history from patient or surrogate, ordering and performing treatments and interventions, ordering and review of laboratory studies, ordering and review of radiographic studies, pulse oximetry and re-evaluation of patient's condition.  Medications Ordered in ED Medications  iopamidol (ISOVUE-370) 76 % injection (not administered)  methylPREDNISolone sodium succinate (SOLU-MEDROL) 125 mg/2 mL injection 60 mg (not administered)  albuterol (PROVENTIL) (2.5 MG/3ML) 0.083% nebulizer solution 2.5 mg (not administered)  ipratropium-albuterol (DUONEB) 0.5-2.5 (3) MG/3ML nebulizer solution 3 mL (not administered)  sodium chloride 0.9 % bolus 1,000 mL (not administered)  heparin bolus via infusion 3,500 Units (not administered)  heparin ADULT infusion 100 units/mL (25000 units/243mL sodium chloride 0.45%) (not administered)  ipratropium-albuterol (DUONEB) 0.5-2.5 (3) MG/3ML nebulizer solution 3 mL (3 mLs Nebulization Given 10/28/15 1649)     Initial Impression / Assessment and Plan / ED Course  I have reviewed the triage vital signs and the nursing notes.  Pertinent labs & imaging results that were available during my care of the patient were reviewed by me and considered in my medical decision making (see chart for details).  Clinical Course    82 y.o. female with history of COPD and chronic dyspnea presents with persistent progressive shortness of breath over the last 4-5 months. She was seen today for echocardiogram which showed severely enlarged  right ventricle and high pulmonary arterial pressures suggestive of pulmonary hypertension and right heart failure. Unclear if this represents pulmonary hypertension relating to chronic hypoxemia and COPD or possible obstructive etiology such as pulmonary embolus causing cor  pulmonale. CT ordered for rule out pulmonary embolus and evaluation of lung parenchyma.  Patient is requiring 6 L of oxygen by nasal cannula to maintain 85%. Breathing treatment offered to help optimize her oxygenation pending definitive workup and likely admission.   Hospitalist was consulted for admission and will see the patient in the emergency department. Discussed with pulmonology at request of hospitalist Dr Thedore Mins. Dr Levada Schilling of pulm assigned Pt to consult list and a provider will come to see her. Dr Molli Knock saw Pt and recommended cardiology consultation for acute right heart failure. Patient is requiring high levels of oxygen to maintain saturation and will require stepdown admission in critical condition.  Final Clinical Impressions(s) / ED Diagnoses   Final diagnoses:  Acute on chronic respiratory failure with hypoxemia (HCC)  Pulmonary hypertension (HCC)  Cor pulmonale (HCC)  Respiratory alkalosis    New Prescriptions New Prescriptions   No medications on file     Lyndal Pulley, MD 10/29/15 [phone]

## 2015-10-28 NOTE — ED Notes (Signed)
Pt SpO2 77-85% on 3L [place] (pt wears 3-4L [place] at baseline). Labored resp, rr 25-30 breaths/min. Placed on NRB, SpO2 improved to 94-96%. Dr. Clydene PughKnott aware and at bedside.

## 2015-10-28 NOTE — H&P (Signed)
TRH H&P   Patient Demographics:    Heidi Morales, is a 82 y.o. female  MRN: [id number]   DOB - 1930-05-04  Admit Date - 10/28/2015  Outpatient Primary MD for the patient is Minda Meo, MD  Outpatient Specialists: Dr Sherene Sires, Dr Allyson Sabal   Patient coming from: Home  Chief Complaint  Patient presents with  . Shortness of Breath      HPI:    Heidi Morales  is a 82 y.o. female, With history of advanced COPD primarily emphysema on 3 L nasal cannula oxygen follows with Dr. Sharee Pimple, CAD Follows with Dr. Allyson Sabal, essential hypertension, dyslipidemia, arthritis, Mild aortic stenosis Was been experiencing gradually progressive shortness of breath for  The last 1 year, now short of breath to the extent that she cannot even ambulate 10 feet, denies any significant orthopnea, no leg edema or swelling, no weight gain, denies any personal or family history of blood clots,  No recent travels on long drives, denies any fever or chills or productive cough, no significant wheezing, She today went to see her cardiologist where echocardiogram was done and was found to have severe pulmonary hypertension with pulmonary artery pressure of 120 with reduced RV function, echo in 2015 showed normal RV function, she was sent to the ER. In the ER she was found to be in acute on chronic hypoxic respiratory failure requiring nonrebreather mask versus 10 L and is on nasal cannula oxygen, I was called to admit. Her initial blood work, EKG and chest x-ray were unremarkable.  Patient besides above symptoms completely negative review of systems.    Review of systems:    In addition to the HPI above,   No Fever-chills, No Headache, No changes with Vision or  hearing, No problems swallowing food or Liquids, No Chest pain, Cough , +++ Shortness of Breath, No Abdominal pain, No Nausea or Vommitting, Bowel movements are regular, No Blood in stool or Urine, No dysuria, No new skin rashes or bruises, No new joints pains-aches,  No new weakness, tingling, numbness in any extremity, No recent weight gain or loss, No polyuria, polydypsia or polyphagia, No significant Mental Stressors.  A full 10 point Review of Systems was done, except as stated above, all other Review of Systems were negative.   With Past History of the following :  Past Medical History:  Diagnosis Date  . Acute posthemorrhagic anemia   . Aortic stenosis   . Arthritis    End-stage arthritis of the left knee  . COPD (chronic obstructive pulmonary disease) (HCC)    COPD  . Dyspnea   . Emphysema   . H/O: hysterectomy    at age 82  . Heart attack (HCC) 03/07/2006   [place]  . Heart disease   . Hyperlipidemia   . Hypertension   . Hypokalemia    postoperative  . Hyponatremia    postoperative  . Ischemic heart disease   . Leukocytosis   . PVC's (premature ventricular contractions)       Past Surgical History:  Procedure Laterality Date  . BUNIONECTOMY  [id number]  . TOTAL KNEE ARTHROPLASTY  2001   right knee  . VARICOSE VEIN SURGERY     at age 40--Bilateral vein stripping      Social History:     Social History  Substance Use Topics  . Smoking status: Former Smoker    Packs/day: 1.00    Years: 50.00    Types: Cigarettes    Quit date: 02/22/2006  . Smokeless tobacco: Never Used  . Alcohol use No         Family History :     Family History  Problem Relation Age of Onset  . Heart disease Mother     myocardial infarction  . Hypertension Mother   . Hypertension Father   . Arthritis Father   . Heart disease Sister [id number]    myocardial infarction  . Hypertension Sister   . Cancer Sister     bone  . Cancer Sister     bone       Home  Medications:   Prior to Admission medications   Medication Sig Start Date End Date Taking? Authorizing Provider  albuterol (PROAIR HFA) 108 (90 Base) MCG/ACT inhaler 2 pffs every 4 hours as needed Patient taking differently: Inhale 2 puffs into the lungs every 4 (four) hours as needed for wheezing or shortness of breath.  10/10/15  Yes Nyoka CowdenMichael B Wert, MD  amLODipine (NORVASC) 5 MG tablet Take 5 mg by mouth daily.     Yes Historical Provider, MD  aspirin 325 MG tablet Take 325 mg by mouth daily.     Yes Historical Provider, MD  BYSTOLIC 10 MG tablet Take 10 mg by mouth daily.  04/05/14  Yes Historical Provider, MD  calcium citrate-vitamin D (CITRACAL+D) 315-200 MG-UNIT per tablet Take 1 tablet by mouth daily.     Yes Historical Provider, MD  Cholecalciferol (VITAMIN D PO) Take 1 tablet by mouth daily.     Yes Historical Provider, MD  hydrochlorothiazide (HYDRODIURIL) 25 MG tablet Take 25 mg by mouth every other day.    Yes Historical Provider, MD  Multiple Vitamins-Minerals (ICAPS PO) Take 1 capsule by mouth 2 (two) times daily.    Yes Historical Provider, MD  OXYGEN Inhale 3-4 L into the lungs continuous. 3 L 24/7- advised to increase to 4 L during exertion   Yes Historical Provider, MD  Tiotropium Bromide-Olodaterol (STIOLTO RESPIMAT) 2.5-2.5 MCG/ACT AERS Inhale 2 puffs into the lungs daily. 01/13/15  Yes Nyoka CowdenMichael B Wert, MD  valsartan (DIOVAN) 320 MG tablet Take 320 mg by mouth daily.     Yes Historical Provider, MD     Allergies:     Allergies  Allergen Reactions  . Promethazine Hcl Other (See Comments)    Drop in Blood  pressure     Physical Exam:   Vitals  Blood pressure 129/99, pulse (!) 44, temperature 97.5 F (36.4 C), temperature source Oral, resp. rate 20, height 5\' 4"  (1.626 m), weight 56.7 kg (125 lb), SpO2 92 %.   1. General elderly white female and considerable shortness of breath,     2. Normal affect and insight, Not Suicidal or Homicidal, Awake Alert, Oriented X  3.  3. No F.N deficits, ALL C.Nerves Intact, Strength 5/5 all 4 extremities, Sensation intact all 4 extremities, Plantars Morales going.  4. Ears and Eyes appear Normal, Conjunctivae clear, PERRLA. Moist Oral Mucosa.  5. Supple Neck, No JVD, No cervical lymphadenopathy appriciated, No Carotid Bruits.  6. Symmetrical Chest wall movement, Good air movement bilaterally, CTAB.  7. RRR, No Gallops, Rubs or Murmurs, No Parasternal Heave.  8. Positive Bowel Sounds, Abdomen Soft, No tenderness, No organomegaly appriciated,No rebound -guarding or rigidity.  9.  No Cyanosis, Normal Skin Turgor, No Skin Rash or Bruise.  10. Good muscle tone,  joints appear normal , no effusions, Normal ROM.  11. No Palpable Lymph Nodes in Neck or Axillae      Data Review:    CBC  Recent Labs Lab 10/28/15 1630  WBC 12.7*  HGB 16.2*  HCT 46.6*  PLT 243  MCV 97.5  MCH 33.9  MCHC 34.8  RDW 13.2  LYMPHSABS 2.2  MONOABS 0.8  EOSABS 0.2  BASOSABS 0.1   ------------------------------------------------------------------------------------------------------------------  Chemistries   Recent Labs Lab 10/28/15 1630  NA 135  K 3.6  CL 101  CO2 23  GLUCOSE 107*  BUN 20  CREATININE 0.93  CALCIUM 10.6*  MG 1.7   ------------------------------------------------------------------------------------------------------------------ estimated creatinine clearance is 39.6 mL/min (by C-G formula based on SCr of 0.93 mg/dL). ------------------------------------------------------------------------------------------------------------------ No results for input(s): TSH, T4TOTAL, T3FREE, THYROIDAB in the last 72 hours.  Invalid input(s): FREET3  Coagulation profile No results for input(s): INR, PROTIME in the last 168 hours. ------------------------------------------------------------------------------------------------------------------- No results for input(s): DDIMER in the last 72  hours. -------------------------------------------------------------------------------------------------------------------  Cardiac Enzymes No results for input(s): CKMB, TROPONINI, MYOGLOBIN in the last 168 hours.  Invalid input(s): CK ------------------------------------------------------------------------------------------------------------------    Component Value Date/Time   BNP 1,267.9 (H) 10/28/2015 1623   BNP 1,305.8 (H) 10/08/2015 1354     ---------------------------------------------------------------------------------------------------------------  Urinalysis    Component Value Date/Time   COLORURINE AMBER BIOCHEMICALS MAY BE AFFECTED BY COLOR (A) 06/15/2010 1040   APPEARANCEUR CLOUDY (A) 06/15/2010 1040   LABSPEC 1.028 06/15/2010 1040   PHURINE 5.5 06/15/2010 1040   GLUCOSEU NEGATIVE 06/15/2010 1040   HGBUR NEGATIVE 06/15/2010 1040   BILIRUBINUR SMALL (A) 06/15/2010 1040   KETONESUR TRACE (A) 06/15/2010 1040   PROTEINUR NEGATIVE 06/15/2010 1040   UROBILINOGEN 0.2 06/15/2010 1040   NITRITE NEGATIVE 06/15/2010 1040   LEUKOCYTESUR  06/15/2010 1040    NEGATIVE MICROSCOPIC NOT DONE ON URINES WITH NEGATIVE PROTEIN, BLOOD, LEUKOCYTES, NITRITE, OR GLUCOSE <1000 mg/dL.    ----------------------------------------------------------------------------------------------------------------   Imaging Results:    Dg Chest Port 1 View  Result Date: 10/28/2015 CLINICAL DATA:  Chest pain short of breath. EXAM: PORTABLE CHEST 1 VIEW COMPARISON:  10/10/2015 FINDINGS: Normal cardiac silhouette. Lungs are mildly hyperinflated. There is mild chronic bronchitic markings at the bases. Emphysematous change in the upper lobes. Atherosclerotic calcification of the aorta. IMPRESSION: Upper lobe emphysema.  No acute findings. Atherosclerotic calcification of the aorta. Electronically Signed   By: Genevive Bi M.D.   On: 10/28/2015 17:08   TTE  Normal LV  systolic function; grade 1 diastolic  dysfunction; D shaped septum; calcified aortic valve with fixed noncoronary cusp and mild AS; mild LAE, RAE and RVE; severely reduced RV function; mild TR with severely elevated pulmonary pressure (estimated PASP 120).  My personal review of EKG: Rhythm NSR, Rate  93 /min,   no Acute ST changes   Assessment & Plan:      1. Acute on chronic hypoxic respiratory failure in a patient with COPD on 3 L nasal cannula oxygen. I do not hear any wheezing, no productive cough or fever, chest x-ray unremarkable,  Echo done and cardiology office shows severely reduced RV function with severe pulmonary hypertension with a normal RV function 2 years ago.  Patient is severely hypoxic, likely has cor pulmonale from second rebound hypertension from underlying COPD however PE needs to be ruled out. For now d-dimer and stat CT angiogram, IV heparin drip until PE is ruled out,  Supportive care with BiPAP/oxygen as needed, she is DO NOT RESUSCITATE, in the meantime I will give her IV steroids for a low probability of mild COPD exacerbation which could be playing a role as well. Pulmonary has been consulted, I have discussed in detail with the patient she is DO NOT RESUSCITATE and would not like to get intubated. Further workup as guided by pulmonary.  2. COPD. 3 to oxygen at home, treatment as in #1 above, continue breathing treatments. Doubt  There is any COPD exacerbation however will try Solu-Medrol for the time being as there is nothing to lose.  3. Chronic diastolic CHF. EF preserved at 55%, no rales on exam, no edema,  BNP elevated due to right-sided failure. Treatment as in #1 above.  4. Mild AS. Stable no acute issues.  5.History of CAD. No acute issues chest pain free,  Continue aspirin and beta blocker for secondary prevention.   DVT Prophylaxis Heparin gtt  AM Labs Ordered, also please review Full Orders  Family Communication: Admission, patients condition and plan of care including tests being ordered  have been discussed with the patient  who indicates understanding and agree with the plan and Code Status.  Code Status DNR  Likely DC to  Home 3-4 days  Condition GUARDED    Consults called: PCCM    Admission status: Stepdown    Time spent in minutes : 35   SINGH,PRASHANT K M.D on 10/28/2015 at 5:52 PM  Between 7am to 7pm - Pager - [phone]. After 7pm go to [email or web address] - password [email or web address] [facility]  [facility] - Office  [phone]

## 2015-10-28 NOTE — ED Notes (Signed)
Pt returned from CT, transport tech reported to RN that pt was having difficulty breathing. SpO2 67-69% on 3L [place]. Placed on NRB, SpO2 improved to 92-94%. Dr. Clydene PughKnott aware and at bedside.

## 2015-10-28 NOTE — Progress Notes (Signed)
Mrs. Heidi Morales presented for echocardiogram this afternoon. Her 08/2013 echo was unremarkable with 60% EF and mild AS. Today, her echo demonstrated normal EF but her PA pressures were above 100mmHg and she had a hard time catching her breath the entire time she was on the table during the echo. Dr. Mayford Knifeurner (DOD) was notified and advised her to go to the ED for further evaluation. Karsten FellsKaty Kemp, RN is notifying the ED of her arrival.

## 2015-10-28 NOTE — Progress Notes (Signed)
ANTICOAGULATION CONSULT NOTE - Initial Consult  Pharmacy Consult for heparin Indication: r/o PE  Allergies  Allergen Reactions  . Promethazine Hcl Other (See Comments)    Drop in Blood pressure    Patient Measurements: Height: 5\' 4"  (162.6 cm) Weight: 125 lb (56.7 kg) IBW/kg (Calculated) : 54.7 Heparin Dosing Weight: 56.7kg  Vital Signs: Temp: 97.5 F (36.4 C) (09/05 1558) Temp Source: Oral (09/05 1558) BP: 129/99 (09/05 1558) Pulse Rate: 44 (09/05 1558)  Labs:  Recent Labs  10/28/15 1630  HGB 16.2*  HCT 46.6*  PLT 243  CREATININE 0.93    Estimated Creatinine Clearance: 39.6 mL/min (by C-G formula based on SCr of 0.93 mg/dL).   Medical History: Past Medical History:  Diagnosis Date  . Acute posthemorrhagic anemia   . Aortic stenosis   . Arthritis    End-stage arthritis of the left knee  . COPD (chronic obstructive pulmonary disease) (HCC)    COPD  . Dyspnea   . Emphysema   . H/O: hysterectomy    at age 82  . Heart attack (HCC) 03/07/2006   [place]  . Heart disease   . Hyperlipidemia   . Hypertension   . Hypokalemia    postoperative  . Hyponatremia    postoperative  . Ischemic heart disease   . Leukocytosis   . PVC's (premature ventricular contractions)     Medications:  Infusions:  . heparin    . sodium chloride      Assessment: 82 yof presented to the ED with SOB. CT ordered to r/o PE. To start IV heparin in the meantime. CBC is WNL and pt is not on anticoagulation PTA.   Goal of Therapy:  Heparin level 0.3-0.7 units/ml Monitor platelets by anticoagulation protocol: Yes   Plan:  - Heparin bolus 3500 units IV x 1 - Heparin gtt 900 units/hr - Check an 8 hr heparin level - Daily heparin level and CBC - F/u CT  Heidi Morales, Heidi Morales 10/28/2015,5:54 PM

## 2015-10-28 NOTE — ED Notes (Signed)
Harley @ Dr. Mayford Knifeurner office called, requesting D Dimer, consult pulmonology.  Echo has declined severely since last echo done several years ago.

## 2015-10-29 ENCOUNTER — Other Ambulatory Visit: Payer: Self-pay

## 2015-10-29 ENCOUNTER — Encounter (HOSPITAL_COMMUNITY): Payer: Self-pay | Admitting: General Practice

## 2015-10-29 DIAGNOSIS — Z515 Encounter for palliative care: Secondary | ICD-10-CM

## 2015-10-29 DIAGNOSIS — Z7189 Other specified counseling: Secondary | ICD-10-CM

## 2015-10-29 DIAGNOSIS — R0600 Dyspnea, unspecified: Secondary | ICD-10-CM

## 2015-10-29 DIAGNOSIS — J9622 Acute and chronic respiratory failure with hypercapnia: Secondary | ICD-10-CM

## 2015-10-29 DIAGNOSIS — I350 Nonrheumatic aortic (valve) stenosis: Secondary | ICD-10-CM

## 2015-10-29 DIAGNOSIS — I2609 Other pulmonary embolism with acute cor pulmonale: Secondary | ICD-10-CM

## 2015-10-29 DIAGNOSIS — R000 Tachycardia, unspecified: Secondary | ICD-10-CM

## 2015-10-29 LAB — CBC
HEMATOCRIT: 45.2 % (ref 36.0–46.0)
Hemoglobin: 15.7 g/dL — ABNORMAL HIGH (ref 12.0–15.0)
MCH: 34.2 pg — ABNORMAL HIGH (ref 26.0–34.0)
MCHC: 34.7 g/dL (ref 30.0–36.0)
MCV: 98.5 fL (ref 78.0–100.0)
Platelets: 234 10*3/uL (ref 150–400)
RBC: 4.59 MIL/uL (ref 3.87–5.11)
RDW: 13.1 % (ref 11.5–15.5)
WBC: 10.1 10*3/uL (ref 4.0–10.5)

## 2015-10-29 LAB — GLUCOSE, CAPILLARY
GLUCOSE-CAPILLARY: 144 mg/dL — AB (ref 65–99)
GLUCOSE-CAPILLARY: 177 mg/dL — AB (ref 65–99)
GLUCOSE-CAPILLARY: 157 mg/dL — AB (ref 65–99)
GLUCOSE-CAPILLARY: 146 mg/dL — AB (ref 65–99)

## 2015-10-29 LAB — BASIC METABOLIC PANEL
Anion gap: 18 — ABNORMAL HIGH (ref 5–15)
BUN: 18 mg/dL (ref 6–20)
CALCIUM: 10 mg/dL (ref 8.9–10.3)
CHLORIDE: 98 mmol/L — AB (ref 101–111)
CO2: 21 mmol/L — AB (ref 22–32)
CREATININE: 1.02 mg/dL — AB (ref 0.44–1.00)
GFR calc Af Amer: 57 mL/min — ABNORMAL LOW (ref >60)
GFR calc non Af Amer: 49 mL/min — ABNORMAL LOW (ref >60)
GLUCOSE: 155 mg/dL — AB (ref 65–99)
Potassium: 3.6 mmol/L (ref 3.5–5.1)
Sodium: 137 mmol/L (ref 135–145)

## 2015-10-29 LAB — ANGIOTENSIN CONVERTING ENZYME: Angiotensin-Converting Enzyme: 33 U/L (ref 14–82)

## 2015-10-29 LAB — ANTI-SCLERODERMA ANTIBODY: Scleroderma (Scl-70) (ENA) Antibody, IgG: <0.2 AI (ref 0.0–0.9)

## 2015-10-29 LAB — ANCA TITERS: C-ANCA: <1:20 {titer}

## 2015-10-29 LAB — RHEUMATOID FACTOR

## 2015-10-29 LAB — ANTINUCLEAR ANTIBODIES, IFA: ANA Ab, IFA: NEGATIVE

## 2015-10-29 MED ORDER — HEPARIN SODIUM (PORCINE) 5000 UNIT/ML IJ SOLN
5000.0000 [IU] | Freq: Three times a day (TID) | INTRAMUSCULAR | Status: DC
  Administered 2015-10-29 – 2015-10-30 (×3): 5000 [IU] via SUBCUTANEOUS
  Filled 2015-10-29 (×3): qty 1

## 2015-10-29 MED ORDER — METHYLPREDNISOLONE SODIUM SUCC 40 MG IJ SOLR
40.0000 mg | Freq: Two times a day (BID) | INTRAMUSCULAR | Status: DC
  Administered 2015-10-29 – 2015-10-30 (×2): 40 mg via INTRAVENOUS
  Filled 2015-10-29 (×2): qty 1

## 2015-10-29 MED ORDER — FUROSEMIDE 10 MG/ML IJ SOLN
40.0000 mg | Freq: Once | INTRAMUSCULAR | Status: AC
  Administered 2015-10-29: 40 mg via INTRAVENOUS
  Filled 2015-10-29: qty 4

## 2015-10-29 MED ORDER — IPRATROPIUM-ALBUTEROL 0.5-2.5 (3) MG/3ML IN SOLN
3.0000 mL | Freq: Three times a day (TID) | RESPIRATORY_TRACT | Status: DC
  Administered 2015-10-30: 3 mL via RESPIRATORY_TRACT
  Filled 2015-10-29: qty 3

## 2015-10-29 MED ORDER — POTASSIUM CHLORIDE CRYS ER 20 MEQ PO TBCR
40.0000 meq | EXTENDED_RELEASE_TABLET | Freq: Two times a day (BID) | ORAL | Status: DC
  Administered 2015-10-29 (×2): 40 meq via ORAL
  Filled 2015-10-29 (×2): qty 2

## 2015-10-29 MED ORDER — ALPRAZOLAM 0.5 MG PO TABS
0.5000 mg | ORAL_TABLET | Freq: Once | ORAL | Status: AC
  Administered 2015-10-29: 0.5 mg via ORAL
  Filled 2015-10-29: qty 1

## 2015-10-29 MED ORDER — MORPHINE SULFATE 10 MG/5ML PO SOLN
2.5000 mg | ORAL | Status: DC | PRN
  Administered 2015-10-29 – 2015-10-30 (×3): 2.5 mg via ORAL
  Filled 2015-10-29 (×3): qty 2

## 2015-10-29 MED ORDER — ZOLPIDEM TARTRATE 5 MG PO TABS: 5.0000 mg | ORAL_TABLET | Freq: Once | ORAL | Status: DC

## 2015-10-29 MED ORDER — MAGNESIUM SULFATE 4 GM/100ML IV SOLN
4.0000 g | Freq: Once | INTRAVENOUS | Status: AC
  Administered 2015-10-29: 4 g via INTRAVENOUS
  Filled 2015-10-29: qty 100

## 2015-10-29 NOTE — Consult Note (Signed)
Consultation Note Date: 10/29/2015   Patient Name: Heidi Morales  DOB: March 13, 1930  MRN: [id number]  Age / Sex: 82 y.o., female  PCP: Geoffry Paradise, MD Referring Physician: Leroy Sea, MD  Reason for Consultation: Establishing goals of care and Hospice Evaluation  HPI/Patient Profile: 82 y.o. female  with past medical history of acute on chronic respiratory failure, end stage COPD with 3L home oxygen, emphysema, hypertension, hyperlipidemia, heart disease, myocardial infarction, aortic stenosis, arthritis, admitted on 10/28/2015 from her cardiologist's office after echo was found to have severe pulmonary hypertension. PE ruled out. Required non-rebreather mask and Bipap for short time upon admission. Cardiology consulted. Patient receiving lasix but would not benefit from right heart catheterization or vasodilators. Palliative Medicine consultation for goals of care regarding end stage emphysema and pulmonary hypertension.   Clinical Assessment and Goals of Care: Patient awake, alert, and sitting up in bed upon arrival to room. She has severe dyspnea at rest and pursed lip breathing. Denies pain or discomfort. Daughter, Heidi Morales, at bedside visiting from [place], [place]. [name] has a husband of 48 years and two other children in the area. Patient speaks of her decline in the past year from COPD. She has been living independently with her husband but it has been more difficult to get around the past few weeks. She is able to sleep at night in her bed. She states she has had a poor appetite. 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 [name] understands that she has a terminal condition that cannot be reversed. As much as she wishes she could return home after discharge, she knows it is most likely not possible due to her decline. Discussed how palliative care can  support her with symptom management and maintain quality of life for as long as she may have to live. Discussed home hospice vs. Residential hospice with patient and daughter. She states she has friends that have volunteered at [facility] and she is familiar with hospice. She is agreeable with this plan but would like to discuss further with her husband and the palliative team in the morning.    SUMMARY OF RECOMMENDATIONS    DNR/DNI per patient.   Dyspnea-prn morphine (see symptom management) and fan at the bedside.   Discussed palliative care and symptom management options with patient.  Discussed hospice with patient and daughter. Disposition most likely residential hospice but patient would like to have further discussions in AM with her husband and Palliative Medicine Team.   PMT will f/u in am on 9/7.   Code Status/Advance Care Planning:  DNR   Symptom Management:   Dyspnea-Morphine 2.5mg  PO q3h prn dyspnea/pain   Palliative Prophylaxis:   Aspiration, Delirium Protocol and Frequent Pain Assessment  Psycho-social/Spiritual:   Desire for further Chaplaincy support:no  Additional Recommendations: Caregiving  Support/Resources and Education on Hospice  Prognosis:   < 2 weeks-in the setting of end stage COPD, severe pulmonary hypertension, and functional decline.   Discharge Planning: To  Be Determined-Likely residential hospice. Further discussions with patient and husband in AM on 9/7.      Primary Diagnoses: Present on Admission: . CAD (coronary artery disease) . COPD GOLD I with predominant emphysema/ chronic 02 dep  . Essential hypertension . Acute respiratory failure (HCC) . Acute and chronic respiratory failure with hypercapnia (HCC)   I have reviewed the medical record, interviewed the patient and family, and examined the patient. The following aspects are pertinent.  Past Medical History:  Diagnosis Date  . Acute posthemorrhagic anemia   . Aortic  stenosis   . Arthritis    End-stage arthritis of the left knee  . COPD (chronic obstructive pulmonary disease) (HCC)    COPD  . Dyspnea   . Emphysema   . H/O: hysterectomy    at age 58  . Heart attack (HCC) 03/07/2006   stent, [place]  . Heart disease   . Hyperlipidemia   . Hypertension   . Hypokalemia    postoperative  . Hyponatremia    postoperative  . Ischemic heart disease   . Leukocytosis   . PVC's (premature ventricular contractions)   . Respiratory failure, acute and chronic (HCC) 10/2015   Social History   Social History  . Marital status: Married    Spouse name: N/A  . Number of children: N/A  . Years of education: N/A   Social History Main Topics  . Smoking status: Former Smoker    Packs/day: 1.00    Years: 50.00    Types: Cigarettes    Quit date: 02/22/2006  . Smokeless tobacco: Never Used  . Alcohol use No  . Drug use: Unknown  . Sexual activity: Not Asked   Other Topics Concern  . None   Social History Narrative  . None   Family History  Problem Relation Age of Onset  . Heart disease Mother     myocardial infarction  . Hypertension Mother   . Hypertension Father   . Arthritis Father   . Heart disease Sister 67    myocardial infarction  . Hypertension Sister   . Cancer Sister     bone  . Cancer Sister     bone   Scheduled Meds: . amLODipine  5 mg Oral Daily  . aspirin  325 mg Oral Daily  . heparin subcutaneous  5,000 Units Subcutaneous Q8H  . ipratropium-albuterol  3 mL Nebulization Q6H  . irbesartan  75 mg Oral Daily  . methylPREDNISolone (SOLU-MEDROL) injection  40 mg Intravenous Q12H  . mometasone-formoterol  2 puff Inhalation BID  . nebivolol  10 mg Oral Daily  . potassium chloride  40 mEq Oral BID  . sodium chloride flush  3 mL Intravenous Q12H   Continuous Infusions:  PRN Meds:.albuterol, bisacodyl, HYDROcodone-acetaminophen, morphine, ondansetron **OR** ondansetron (ZOFRAN) IV, sodium chloride Medications Prior to  Admission:  Prior to Admission medications   Medication Sig Start Date End Date Taking? Authorizing Provider  albuterol (PROAIR HFA) 108 (90 Base) MCG/ACT inhaler 2 pffs every 4 hours as needed Patient taking differently: Inhale 2 puffs into the lungs every 4 (four) hours as needed for wheezing or shortness of breath.  10/10/15  Yes Nyoka Cowden, MD  amLODipine (NORVASC) 5 MG tablet Take 5 mg by mouth daily.     Yes Historical Provider, MD  aspirin 325 MG tablet Take 325 mg by mouth daily.     Yes Historical Provider, MD  BYSTOLIC 10 MG tablet Take 10 mg by mouth daily.  04/05/14  Yes Historical Provider, MD  calcium citrate-vitamin D (CITRACAL+D) 315-200 MG-UNIT per tablet Take 1 tablet by mouth daily.     Yes Historical Provider, MD  Cholecalciferol (VITAMIN D PO) Take 1 tablet by mouth daily.     Yes Historical Provider, MD  hydrochlorothiazide (HYDRODIURIL) 25 MG tablet Take 25 mg by mouth every other day.    Yes Historical Provider, MD  Multiple Vitamins-Minerals (ICAPS PO) Take 1 capsule by mouth 2 (two) times daily.    Yes Historical Provider, MD  OXYGEN Inhale 3-4 L into the lungs continuous. 3 L 24/7- advised to increase to 4 L during exertion   Yes Historical Provider, MD  Tiotropium Bromide-Olodaterol (STIOLTO RESPIMAT) 2.5-2.5 MCG/ACT AERS Inhale 2 puffs into the lungs daily. 01/13/15  Yes Nyoka CowdenMichael B Wert, MD  valsartan (DIOVAN) 320 MG tablet Take 320 mg by mouth daily.     Yes Historical Provider, MD   Allergies  Allergen Reactions  . Promethazine Hcl Other (See Comments)    Drop in Blood pressure   Review of Systems  Constitutional: Positive for activity change, appetite change and fatigue.  Respiratory: Positive for shortness of breath.   Cardiovascular: Negative.   Gastrointestinal: Negative.   Genitourinary: Negative.   Skin: Negative.   Neurological: Positive for weakness.  Psychiatric/Behavioral: Negative.     Physical Exam  Constitutional: She is oriented to  person, place, and time. She is cooperative.  Cardiovascular: Regular rhythm and normal heart sounds.   Pulmonary/Chest: Accessory muscle usage present. Tachypnea noted. She is in respiratory distress. She has decreased breath sounds.  + pursed lip breathing and dyspnea at rest.   Abdominal: Soft. Bowel sounds are normal.  Neurological: She is alert and oriented to person, place, and time.  Skin: Skin is warm and dry.  Psychiatric: She has a normal mood and affect. Her speech is normal and behavior is normal. Thought content normal. Cognition and memory are normal.  Nursing note and vitals reviewed.   Vital Signs: BP 123/77 (BP Location: Left Arm)   Pulse (!) 109   Temp 98.1 F (36.7 C) (Axillary)   Resp (!) 22   Ht 5\' 2"  (1.575 m)   Wt 60.1 kg (132 lb 7.9 oz)   SpO2 94%   BMI 24.23 kg/m  Pain Assessment: No/denies pain   Pain Score: 0-No pain   SpO2: SpO2: 94 % O2 Device:SpO2: 94 % O2 Flow Rate: .O2 Flow Rate (L/min): 10 L/min  IO: Intake/output summary:   Intake/Output Summary (Last 24 hours) at 10/29/15 1514 Last data filed at 10/29/15 1343  Gross per 24 hour  Intake          3253.75 ml  Output              575 ml  Net          2678.75 ml    LBM: Last BM Date: 10/28/15 Baseline Weight: Weight: 56.7 kg (125 lb) Most recent weight: Weight: 60.1 kg (132 lb 7.9 oz)     Palliative Assessment/Data: PPS 30%   Flowsheet Rows   Flowsheet Row Most Recent Value  Intake Tab  Referral Department  Cardiology  Unit at Time of Referral  Intermediate Care Unit  Palliative Care Primary Diagnosis  Cardiac  Date Notified  10/29/15  Palliative Care Type  New Palliative care  Reason for referral  Clarify Goals of Care  Date of Admission  10/28/15  # of days IP prior to Palliative referral  1  Clinical Assessment  Palliative Performance Scale Score  30%  Psychosocial & Spiritual Assessment  Palliative Care Outcomes      Time In: 1400 Time Out: 1510 Time Total:  Greater than 50%  of this time was spent counseling and coordinating care related to the above assessment and plan.  Signed by:  Vennie Homans, FNP-C Palliative Medicine Team  Phone: [phone] Fax: [phone]   Addendum:  Patient seen and examined Agree with note above  Rosalin Hawking MD [facility] health palliative medicine team [phone]    Please contact Palliative Medicine Team phone at [phone] for questions and concerns.  For individual provider: See Loretha Stapler

## 2015-10-29 NOTE — Evaluation (Signed)
Physical Therapy Evaluation Patient Details Name: Heidi Morales MRN: [id number] DOB: 04/08/1930 Today's Date: 10/29/2015   History of Present Illness  82 year old female with hx significant for end stage COPD (followed by Dr. Sherene SiresWert, on O2 3lpm), CAD, and HLD. She has had a progressive worsening of her SOB over the last year which has now progressed to the point she is unable to walk 10 feet. She was seen in the pulmonary clinic by Dr. Sherene SiresWert 8/18 who elicited that she has been needing to sleep with more and more pillows over that time period and added a prednisone taper in case there was any allergy/asthma component. He felt a large portion of this was due to CHF which is in the process of being worked up by Dr. Allyson SabalBerry. As part of that workup 9/5 she presented for echo and was found to have severe pulmonary hypertension with PA pressure 120 and she was sent to ED where she required 10L O2 via [place] to keep sat's up. She is admitted to the hospitalitis for pulmonary HTN, they also plan to rule out PE. PCCM to consult.  Clinical Impression  Pt admitted with above diagnosis. Pt currently with functional limitations due to the deficits listed below (see PT Problem List). Pt tolerated very little activity.  On 6LO2 with DOE 3/4 with sitting EOB only.  Pt did discuss with PT that she is terminal and that she may not be able to continue PT but as of now she would like us to continue to try to work with her.  Will follow acutely. Pt will benefit from skilled PT to increase their independence and safety with mobility to allow discharge to the venue listed below.      Follow Up Recommendations Home health PT;Supervision/Assistance - 24 hour    Equipment Recommendations  None recommended by PT    Recommendations for Other Services       Precautions / Restrictions Precautions Precautions: Fall Restrictions Weight Bearing Restrictions: No      Mobility  Bed Mobility Overal bed mobility: Needs Assistance Bed  Mobility: Supine to Sit     Supine to sit: Min guard     General bed mobility comments: Pt was able to get to EOB with only a little assist.  Transfers                 General transfer comment: pt only wanted to sit EOB as she was winded just gettting to EOB and sitting for 5 min  Ambulation/Gait                Stairs            Wheelchair Mobility    Modified Rankin (Stroke Patients Only)       Balance Overall balance assessment: Needs assistance Sitting-balance support: No upper extremity supported;Feet supported Sitting balance-Leahy Scale: Fair Sitting balance - Comments: Sat EOB 5 min without physical assist however was DOE 3/4.                                     Pertinent Vitals/Pain Pain Assessment: No/denies pain  Sats down to 85% with activity but with rest to 92% on 6LO2.  Hr up to 120 bpm with activity.      Home Living Family/patient expects to be discharged to:: Private residence Living Arrangements: Spouse/significant other Available Help at Discharge: Family;Available 24 hours/day;Personal care attendant (  aide 3 days a week for 6 hours day) Type of Home: House Home Access: Stairs to enter Entrance Stairs-Rails: Left Entrance Stairs-Number of Steps: 4 Home Layout: Two level;Bed/bath upstairs Home Equipment: Emergency planning/management officer - 4 wheels;Cane - single point      Prior Function Level of Independence: Needs assistance   Gait / Transfers Assistance Needed: Ambulated with rollator downstairs and husband assist upstairs  ADL's / Homemaking Assistance Needed: total assist to mod assist        Hand Dominance        Extremity/Trunk Assessment   Upper Extremity Assessment: Defer to OT evaluation           Lower Extremity Assessment: Generalized weakness      Cervical / Trunk Assessment: Kyphotic  Communication   Communication:  (had difficulty breathing)  Cognition Arousal/Alertness:  Awake/alert Behavior During Therapy: Flat affect Overall Cognitive Status: Within Functional Limits for tasks assessed                      General Comments      Exercises General Exercises - Lower Extremity Long Arc Quad: AROM;Both;10 reps;Seated      Assessment/Plan    PT Assessment Patient needs continued PT services  PT Diagnosis Generalized weakness   PT Problem List Decreased activity tolerance;Decreased balance;Decreased mobility;Decreased knowledge of use of DME;Decreased safety awareness;Decreased knowledge of precautions  PT Treatment Interventions DME instruction;Gait training;Functional mobility training;Therapeutic activities;Therapeutic exercise;Balance training;Patient/family education;Stair training   PT Goals (Current goals can be found in the Care Plan section) Acute Rehab PT Goals Patient Stated Goal: to go home PT Goal Formulation: With patient Time For Goal Achievement: 11/12/15 Potential to Achieve Goals: Good    Frequency Min 3X/week   Barriers to discharge        Co-evaluation               End of Session Equipment Utilized During Treatment: Gait belt;Oxygen Activity Tolerance: Patient limited by fatigue Patient left: with call bell/phone within reach;in bed;with bed alarm set Nurse Communication: Mobility status         Time: 1207-1218 PT Time Calculation (min) (ACUTE ONLY): 11 min   Charges:   PT Evaluation $PT Eval Moderate Complexity: 1 Procedure     PT G CodesBerline Lopes 2015-11-23, 1:13 PM Jermanie Minshall [facility] Acute Rehabilitation [phone] (pager)

## 2015-10-29 NOTE — Progress Notes (Signed)
Pt started to desat on salter at 4lpm and o2 was increased to 9lpm with minimum changes.  New o2 sensor placed for more accurate reading and sats stayed 88-90%.  RT replaced salter with venti mask at 10lpm and 45% and pts sats returned to 93%.  Rt will monitor.

## 2015-10-29 NOTE — Progress Notes (Signed)
RT transported patient from ED to [facility] without any complications.

## 2015-10-29 NOTE — Progress Notes (Signed)
PROGRESS NOTE                                                                                                                                                                                                             Patient Demographics:    Heidi Morales, is a 82 y.o. female, DOB - 03/18/1930, ZOX:[id number]  Admit date - 10/28/2015   Admitting Physician Leroy SeaPrashant K Cortney Mckinney, MD  Outpatient Primary MD for the patient is Minda MeoARONSON,RICHARD A, MD  LOS - 1  Chief Complaint  Patient presents with  . Shortness of Breath       Brief Narrative   Heidi Fitchnne Crothers  is a 82 y.o. female, With history of advanced COPD primarily emphysema on 3 L nasal cannula oxygen follows with Dr. Sharee PimpleWirt, CAD Follows with Dr. Allyson SabalBerry, essential hypertension, dyslipidemia, arthritis, Mild aortic stenosis Was been experiencing gradually progressive shortness of breath for  The last 1 year, now short of breath to the extent that she cannot even ambulate 10 feet, denies any significant orthopnea, no leg edema or swelling, no weight gain, denies any personal or family history of blood clots,  No recent travels on long drives, denies any fever or chills or productive cough, no significant wheezing, She today went to see her cardiologist where echocardiogram was done and was found to have severe pulmonary hypertension with pulmonary artery pressure of 120 with reduced RV function, echo in 2015 showed normal RV function, she was sent to the ER.   In the ER she was found to be in acute on chronic hypoxic respiratory failure requiring nonrebreather mask versus 10 L and is on nasal cannula oxygen, I was called to admit. Her initial blood work, EKG and chest x-ray were unremarkable.    Subjective:    Heidi Morales today has, No headache, No chest pain, No abdominal pain - No Nausea, No new weakness tingling or numbness, No Cough - +++ SOB.    Assessment  & Plan :     1. Acute on  chronic hypoxic respiratory failure in a patient with COPD on 3 L nasal cannula oxygen. I do not hear any wheezing, no productive cough or fever, chest x-ray unremarkable,  Echo done and cardiology office shows severely reduced RV function with severe pulmonary hypertension with a normal RV function 2 years ago.  Patient is  severely hypoxic, likely has cor pulmonale from secondary pulmonary hypertension from underlying COPD, CT angiogram done which rules out PE,  Supportive care with BiPAP/oxygen as needed, she is DO NOT RESUSCITATE, pulmonary is following and they have consulted cardiology as well, she likely will require right heart catheterization, further workup per pulmonary and cardiology, prognosis appears severely guarded.  2. COPD. she uses 3 L nasal cannula oxygen at home, treatment as in #1 above, continue breathing treatments. I doubt there is any COPD exacerbation however will try Solu-Medrol for the time being as there is nothing to lose.  3. Chronic diastolic CHF. EF preserved at 55%, no rales on exam, no edema,  BNP elevated due to right-sided failure. Treatment as in #1 above. Cardiology to follow. No edema on exam.  4. Mild AS. Stable no acute issues.  5.History of CAD. No acute issues chest pain free,  Continue aspirin and beta blocker for secondary prevention.    Family Communication  :  Husband  Code Status :  DNR  Diet : Clears  Disposition Plan  :  Step down  Consults  :  PCCM, Cards  Procedures  :   CTA Chest - No PE, Pulm HTN, lymphadenopathy  TTE - Normal LV systolic function; grade 1 diastolic dysfunction; D shaped septum; calcified aortic valve with fixed noncoronary cusp and mild AS; mild LAE, RAE and RVE; severely reduced RV function; mild TR with severely elevated pulmonary pressure (estimated PASP120).    DVT Prophylaxis  :    Heparin   Lab Results  Component Value Date   PLT 234 10/29/2015    Inpatient Medications  Scheduled Meds: .  amLODipine  5 mg Oral Daily  . aspirin  325 mg Oral Daily  . heparin subcutaneous  5,000 Units Subcutaneous Q8H  . ipratropium-albuterol  3 mL Nebulization Q6H  . irbesartan  75 mg Oral Daily  . methylPREDNISolone (SOLU-MEDROL) injection  60 mg Intravenous TID  . mometasone-formoterol  2 puff Inhalation BID  . nebivolol  10 mg Oral Daily  . sodium chloride flush  3 mL Intravenous Q12H   Continuous Infusions: . sodium chloride 1,000 mL (10/28/15 2241)   PRN Meds:.albuterol, bisacodyl, HYDROcodone-acetaminophen, ondansetron **OR** ondansetron (ZOFRAN) IV, sodium chloride  Antibiotics  :    Anti-infectives    None         Objective:   Vitals:   10/29/15 0015 10/29/15 0047 10/29/15 0300 10/29/15 0523  BP: 119/76 119/76  119/81  Pulse: 97 (!) 105  (!) 103  Resp: 18 18  15   Temp:    97.4 F (36.3 C)  TempSrc:    Axillary  SpO2: 100% 96% 97% 94%  Weight:    60.1 kg (132 lb 7.9 oz)  Height:        Wt Readings from Last 3 Encounters:  10/29/15 60.1 kg (132 lb 7.9 oz)  10/10/15 58.5 kg (129 lb)  10/08/15 58.9 kg (129 lb 12.8 oz)     Intake/Output Summary (Last 24 hours) at 10/29/15 0833 Last data filed at 10/29/15 1610  Gross per 24 hour  Intake          2353.75 ml  Output              275 ml  Net          2078.75 ml     Physical Exam  Awake Alert, Oriented X 3, No new F.N deficits, Normal affect [place].AT,PERRAL Supple Neck,No JVD, No cervical lymphadenopathy appriciated.  Symmetrical Chest wall movement, Good air movement bilaterally, CTAB RRR,No Gallops,Rubs or new Murmurs, No Parasternal Heave +ve B.Sounds, Abd Soft, No tenderness, No organomegaly appriciated, No rebound - guarding or rigidity. No Cyanosis, Clubbing or edema, No new Rash or bruise       Data Review:    CBC  Recent Labs Lab 10/28/15 1630 10/29/15 0240  WBC 12.7* 10.1  HGB 16.2* 15.7*  HCT 46.6* 45.2  PLT 243 234  MCV 97.5 98.5  MCH 33.9 34.2*  MCHC 34.8 34.7  RDW 13.2 13.1    LYMPHSABS 2.2  --   MONOABS 0.8  --   EOSABS 0.2  --   BASOSABS 0.1  --     Chemistries   Recent Labs Lab 10/28/15 1630 10/29/15 0240  NA 135 137  K 3.6 3.6  CL 101 98*  CO2 23 21*  GLUCOSE 107* 155*  BUN 20 18  CREATININE 0.93 1.02*  CALCIUM 10.6* 10.0  MG 1.7  --    ------------------------------------------------------------------------------------------------------------------ No results for input(s): CHOL, HDL, LDLCALC, TRIG, CHOLHDL, LDLDIRECT in the last 72 hours.  No results found for: HGBA1C ------------------------------------------------------------------------------------------------------------------  Recent Labs  10/28/15 1738  TSH 1.475   ------------------------------------------------------------------------------------------------------------------ No results for input(s): VITAMINB12, FOLATE, FERRITIN, TIBC, IRON, RETICCTPCT in the last 72 hours.  Coagulation profile  Recent Labs Lab 10/28/15 1738  INR 1.02     Recent Labs  10/28/15 1738  DDIMER 0.61*    Cardiac Enzymes No results for input(s): CKMB, TROPONINI, MYOGLOBIN in the last 168 hours.  Invalid input(s): CK ------------------------------------------------------------------------------------------------------------------    Component Value Date/Time   BNP 1,267.9 (H) 10/28/2015 1623   BNP 1,305.8 (H) 10/08/2015 1354    Micro Results No results found for this or any previous visit (from the past 240 hour(s)).  Radiology Reports Dg Chest 2 View  Result Date: 10/10/2015 CLINICAL DATA:  COPD EXAM: CHEST  2 VIEW COMPARISON:  January 13, 2015 FINDINGS: The heart size remains normal. The hila and mediastinum are unchanged. Atherosclerosis seen in the thoracic aorta. No pulmonary nodules or masses. Mild coarsening of lung markings in the lung bases is stable. No other acute abnormalities. IMPRESSION: No acute abnormalities. Coarsening of lung markings in the bases may be due to  scarring or chronic bronchitic change given history. There have been no acute interval changes. Electronically Signed   By: Gerome Sam III M.D   On: 10/10/2015 14:06   Ct Angio Chest Pe W And/or Wo Contrast  Result Date: 10/28/2015 CLINICAL DATA:  Progressive shortness of breath. Right heart failure. Hypertension. COPD EXAM: CT ANGIOGRAPHY CHEST WITH CONTRAST TECHNIQUE: Multidetector CT imaging of the chest was performed using the standard protocol during bolus administration of intravenous contrast. Multiplanar CT image reconstructions and MIPs were obtained to evaluate the vascular anatomy. CONTRAST:  100 mL Isovue 370 IV COMPARISON:  Chest x-ray 10/28/2015.  Chest CT 06/10/2010 FINDINGS: Negative for pulmonary embolism. Pulmonary artery enlargement compatible with pulmonary artery hypertension. Main pulmonary artery 33 mm in diameter. Right pulmonary artery 31 mm in diameter. Left pulmonary artery 33 mm in diameter. Pulmonary artery enlargement has progressed since 2012. Extensive coronary calcification. Right heart strain. RV/ LV ratio 2.5. Reflux of contrast into the IVC and hepatic veins due to elevated right heart pressure. No pericardial effusion. COPD and emphysema which is advanced. Mild atelectasis in the right lower lobe. Negative for pneumonia. Right lower peritracheal lymph node 13 mm. Multiple anterior mediastinal lymph nodes measuring up to 15 mm. These were  present previously and show mild progression. Left hilar lymph nodes also show mild progression in size. Subcarinal lymph node measures 2.7 cm with progression from the prior study. Mild right hilar lymph lymphadenopathy with progression. 6 x 11 mm right posterior upper lobe nodule axial image 71. This shows mild progression since the prior study when it measured approximately 4 x 7 mm. 5 mm nodular thickening along the major fissure on the right axial image 85 likely a lymph node. Multiple small gallstones.  4 cm right renal cyst. Review  of the MIP images confirms the above findings. IMPRESSION: Negative for pulmonary embolism Pulmonary artery enlargement with pulmonary artery hypertension right heart strain. Severe emphysema Mediastinal and bi hilar adenopathy has progressed since 2012. These may be reactive lymph nodes. Neoplasm and infection are also considerations. Consider one of the following in 3 months for both low-risk and high-risk individuals: (a) repeat chest CT, (b) follow-up PET-CT, or (c) tissue sampling. This recommendation follows the consensus statement: Guidelines for Management of Incidental Pulmonary Nodules Detected on CT Images:From the Fleischner Society 2017; published online before print (10.1148/radiol.1610960454). Posterior Right upper lobe nodule has progressed since the prior study measuring approximately 6 x 11 mm. Electronically Signed   By: Marlan Palau M.D.   On: 10/28/2015 19:14   Dg Chest Port 1 View  Result Date: 10/28/2015 CLINICAL DATA:  Chest pain short of breath. EXAM: PORTABLE CHEST 1 VIEW COMPARISON:  10/10/2015 FINDINGS: Normal cardiac silhouette. Lungs are mildly hyperinflated. There is mild chronic bronchitic markings at the bases. Emphysematous change in the upper lobes. Atherosclerotic calcification of the aorta. IMPRESSION: Upper lobe emphysema.  No acute findings. Atherosclerotic calcification of the aorta. Electronically Signed   By: Genevive Bi M.D.   On: 10/28/2015 17:08    Time Spent in minutes  30   Jackston [place] K M.D on 10/29/2015 at 8:33 AM  Between 7am to 7pm - Pager - [phone]  After 7pm go to [email or web address] - password [facility]  [facility] -  Office  [phone]

## 2015-10-29 NOTE — Progress Notes (Signed)
Name: Heidi Morales MRN: [id number] DOB: December 09, 1930    ADMISSION DATE:  10/28/2015 CONSULTATION DATE:  10/28/2015  REFERRING MD :  Dr. Thedore Mins  CHIEF COMPLAINT:  SOB/hypoxia  HISTORY OF PRESENT ILLNESS:  82 year old female with hx significant for end stage COPD (followed by Dr. Sherene Sires, on O2 3lpm), CAD, and HLD. She has had a progressive worsening of her SOB over the last year which has now progressed to the point she is unable to walk 10 feet. She was seen in the pulmonary clinic by Dr. Sherene Sires 8/18 who elicited that she has been needing to sleep with more and more pillows over that time period and added a prednisone taper in case there was any allergy/asthma component. He felt a large portion of this was due to CHF which is in the process of being worked up by Dr. Allyson Sabal. As part of that workup 9/5 she presented for echo and was found to have severe pulmonary hypertension with PA pressure 120 and she was sent to ED where she required 10L O2 via [place] to keep sat's up. She is admitted to the hospitalitis for pulmonary HTN, they also plan to rule out PE. PCCM to consult.  SIGNIFICANT EVENTS    STUDIES:  PFT's 1/20 > FEV1 1.70 (91 % ) ratio 60. No improvement from saba with DLCO  21 % corrects to 22 % for alv volume  2D echo 9/5>>> EF 55-60%, grade 1 diastolic dysfunction, severe reduced systolic function, PA peak pressure CTA chest 9/5>>> neg PE, severe emphysema, increased mediastinal and hilar lymphadenopathy, progression of RUL nodule, pulmonary HTN and R heart strain.   SUBJECTIVE:  Feeling a little better.  Was able to sleep last night.   VITAL SIGNS: Temp:  [97.4 F (36.3 C)-98.1 F (36.7 C)] 97.4 F (36.3 C) (09/06 0523) Pulse Rate:  [44-113] 103 (09/06 0523) Resp:  [15-28] 15 (09/06 0523) BP: (119-175)/(76-104) 147/93 (09/06 0936) SpO2:  [74 %-100 %] 94 % (09/06 0523) FiO2 (%):  [40 %-70 %] 45 % (09/06 0300) Weight:  [56.7 kg (125 lb)-60.1 kg (132 lb 7.9 oz)] 60.1 kg (132 lb 7.9  oz) (09/06 0523)  PHYSICAL EXAMINATION: General:  Elderly female in NAD  Neuro:  Alert, oriented, non-focal HEENT:  [place]/AT, PERRL, no JVD Cardiovascular:  RRR, S1, Split S2, no MRG Lungs:  resps even non labored on [place], exp wheeze, few crackles   Abdomen: Soft, non-tender, non-distended Musculoskeletal:  No acute deformity, no edema Skin:  Grossly intact   Recent Labs Lab 10/28/15 1630 10/29/15 0240  NA 135 137  K 3.6 3.6  CL 101 98*  CO2 23 21*  BUN 20 18  CREATININE 0.93 1.02*  GLUCOSE 107* 155*    Recent Labs Lab 10/28/15 1630 10/29/15 0240  HGB 16.2* 15.7*  HCT 46.6* 45.2  WBC 12.7* 10.1  PLT 243 234   Ct Angio Chest Pe W And/or Wo Contrast  Result Date: 10/28/2015 CLINICAL DATA:  Progressive shortness of breath. Right heart failure. Hypertension. COPD EXAM: CT ANGIOGRAPHY CHEST WITH CONTRAST TECHNIQUE: Multidetector CT imaging of the chest was performed using the standard protocol during bolus administration of intravenous contrast. Multiplanar CT image reconstructions and MIPs were obtained to evaluate the vascular anatomy. CONTRAST:  100 mL Isovue 370 IV COMPARISON:  Chest x-ray 10/28/2015.  Chest CT 06/10/2010 FINDINGS: Negative for pulmonary embolism. Pulmonary artery enlargement compatible with pulmonary artery hypertension. Main pulmonary artery 33 mm in diameter. Right pulmonary artery 31 mm in  diameter. Left pulmonary artery 33 mm in diameter. Pulmonary artery enlargement has progressed since 2012. Extensive coronary calcification. Right heart strain. RV/ LV ratio 2.5. Reflux of contrast into the IVC and hepatic veins due to elevated right heart pressure. No pericardial effusion. COPD and emphysema which is advanced. Mild atelectasis in the right lower lobe. Negative for pneumonia. Right lower peritracheal lymph node 13 mm. Multiple anterior mediastinal lymph nodes measuring up to 15 mm. These were present previously and show mild progression. Left hilar lymph nodes  also show mild progression in size. Subcarinal lymph node measures 2.7 cm with progression from the prior study. Mild right hilar lymph lymphadenopathy with progression. 6 x 11 mm right posterior upper lobe nodule axial image 71. This shows mild progression since the prior study when it measured approximately 4 x 7 mm. 5 mm nodular thickening along the major fissure on the right axial image 85 likely a lymph node. Multiple small gallstones.  4 cm right renal cyst. Review of the MIP images confirms the above findings. IMPRESSION: Negative for pulmonary embolism Pulmonary artery enlargement with pulmonary artery hypertension right heart strain. Severe emphysema Mediastinal and bi hilar adenopathy has progressed since 2012. These may be reactive lymph nodes. Neoplasm and infection are also considerations. Consider one of the following in 3 months for both low-risk and high-risk individuals: (a) repeat chest CT, (b) follow-up PET-CT, or (c) tissue sampling. This recommendation follows the consensus statement: Guidelines for Management of Incidental Pulmonary Nodules Detected on CT Images:From the Fleischner Society 2017; published online before print (10.1148/radiol.1610960454). Posterior Right upper lobe nodule has progressed since the prior study measuring approximately 6 x 11 mm. Electronically Signed   By: Marlan Palau M.D.   On: 10/28/2015 19:14   Dg Chest Port 1 View  Result Date: 10/28/2015 CLINICAL DATA:  Chest pain short of breath. EXAM: PORTABLE CHEST 1 VIEW COMPARISON:  10/10/2015 FINDINGS: Normal cardiac silhouette. Lungs are mildly hyperinflated. There is mild chronic bronchitic markings at the bases. Emphysematous change in the upper lobes. Atherosclerotic calcification of the aorta. IMPRESSION: Upper lobe emphysema.  No acute findings. Atherosclerotic calcification of the aorta. Electronically Signed   By: Genevive Bi M.D.   On: 10/28/2015 17:08    ASSESSMENT / PLAN:  82 yo female with  acute on chronic hypoxemic respiratory failure r/t severe pulmonary HTN, volume overload with underlying severe COPD.  Has been steadily declining over last year, more so over last 3 months.  Symptoms improving slightly with diuresis.  Pt is DNR/DNI.   Acute on chronic hypoxemic respiratory failure: likely due to severe multifactorial pulmonary HTN, volume overload, severe underlying COPD (no AECOPD) REC -   - Titrate O2 for sats of 88-92%.  - BD's  - diuresis as tol   - heart failure team following   - NO role for RHC or vasodilators per cards   - Supplemental O2.  - Autoimmune panel pending   - Solumedrol.  Would agree with pursuing d/c with hospice.   PCCM signing off, please call back if needed.    Dirk Dress, NP 10/29/2015  11:27 AM Pager: [phone] or [phone]  STAFF NOTE: I, Rory Percy, MD FACP have personally reviewed patient's available data, including medical history, events of note, physical examination and test results as part of my evaluation. I have discussed with resident/NP and other care providers such as pharmacist, RN and RRT. In addition, I personally evaluated patient and elicited key findings of: mild distress, not on NIMV,  no sig bronchospasm, lung dz advanced but does NOT explain this degree of pa HTN by itself, CHF service diuresing, lower steroids, use NIMV as only if pt wob increased, neg balance goals, follow autoimmune panel for other cause (unlikely), no role vasodilator, would not change outcome, no repeat pcxr needed, agree pall care would be appropriate, will sign off, call if needed  Mcarthur RossettiDaniel J. Tyson AliasFeinstein, MD, FACP Pgr: [phone] [facility] 10/29/2015 12:30 PM

## 2015-10-29 NOTE — Consult Note (Addendum)
Advanced Heart Failure Team Consult Note  Referring Physician: Dr Heidi Morales  Primary Physician: Dr Heidi Morales  Primary Cardiologist:  Dr Heidi Morales  Reason for Consultation: Pulmonary Hypertension  HPI:   [name] is an 82 year old with a history of end stage COPD (followed by Dr. Sherene Morales, on O2 3lpm), CAD, PVCs, and HLD. Former smoker quit 10 years ago.   She reports progressive dyspnea over the last year. Quit going to church about a year ago. Earlier this month she was seen by Dr Heidi Morales and prednisone taper was started for possible allergy/ asthma component. Dr Heidi Morales also felt HF component was present. She has been unble to walk in the house over the last week due to dyspnea. Reports + orthopnea. Episodes of dizziness. She doesn't think she has gained weight. Had an ECHO yesterday with severely elevated PA pressure 120 mm hg. She was instructed to go to [facility] ED for evaluation.   In the ED she required 10 liter [place] to maintain oxygen saturations. Also required short term bipap but later weaned to nasal cannula. Pulmonary consulted to for pulmonary htn. Placed on steroids and inhalers.  Started on NS IV fluid 75 cc per hour. Today she is dyspneic at rest.   ECHO reviewed by Dr Heidi Morales-  Peak PA Pressure >100. LA mildly dilated. RV markedly dilated and severely HK. IVC dilated.   CTA 10/28/2015  - negative for PE,Pulmonary artery enlargement with pulmonary artery hypertensionright heart strain. Severe emphysema. Mediastinal and bi hilar adenopathy has progressed since 2012. These may be reactive lymph nodes. Neoplasm and infection are also considerations. Posterior Right upper lobe nodule has progressed since the prior study measuring approximately 6 x 11 mm.    Review of Systems: [y] = yes, [ ]  = no   General: Weight gain [ ] ; Weight loss [ ] ; Anorexia [ ] ; Fatigue [Y ]; Fever [ ] ; Chills [ ] ; Weakness [Y ]  Cardiac: Chest pain/pressure [ ] ; Resting SOB [Y ]; Exertional SOB [Y ]; Orthopnea [Y ];  Pedal Edema [ ] ; Palpitations [ ] ; Syncope [ ] ; Presyncope [ ] ; Paroxysmal nocturnal dyspnea[ ]   Pulmonary: Cough [ ] ; Wheezing[ ] ; Hemoptysis[ ] ; Sputum [ ] ; Snoring [ ]   GI: Vomiting[ ] ; Dysphagia[ ] ; Melena[ ] ; Hematochezia [ ] ; Heartburn[ ] ; Abdominal pain [ ] ; Constipation [ ] ; Diarrhea [ ] ; BRBPR [ ]   GU: Hematuria[ ] ; Dysuria [ ] ; Nocturia[ ]   Vascular: Pain in legs with walking [ ] ; Pain in feet with lying flat [ ] ; Non-healing sores [ ] ; Stroke [ ] ; [name] [ ] ; Slurred speech [ ] ;  Neuro: Headaches[ ] ; Vertigo[ ] ; Seizures[ ] ; Paresthesias[ ] ;Blurred vision [ ] ; Diplopia [ ] ; Vision changes [ ]   Ortho/Skin: Arthritis [ ] ; Joint pain [Y ]; Muscle pain [ ] ; Joint swelling [ ] ; Back Pain [ ] ; Rash [ ]   Psych: Depression[ ] ; Anxiety[ ]   Heme: Bleeding problems [ ] ; Clotting disorders [ ] ; Anemia [ ]   Endocrine: Diabetes [ ] ; Thyroid dysfunction[ ]   Home Medications Prior to Admission medications   Medication Sig Start Date End Date Taking? Authorizing Provider  albuterol (PROAIR HFA) 108 (90 Base) MCG/ACT inhaler 2 pffs every 4 hours as needed Patient taking differently: Inhale 2 puffs into the lungs every 4 (four) hours as needed for wheezing or shortness of breath.  10/10/15  Yes Nyoka Cowden, MD  amLODipine (NORVASC) 5 MG tablet Take 5 mg by mouth daily.     Yes  Historical Provider, MD  aspirin 325 MG tablet Take 325 mg by mouth daily.     Yes Historical Provider, MD  BYSTOLIC 10 MG tablet Take 10 mg by mouth daily.  04/05/14  Yes Historical Provider, MD  calcium citrate-vitamin D (CITRACAL+D) 315-200 MG-UNIT per tablet Take 1 tablet by mouth daily.     Yes Historical Provider, MD  Cholecalciferol (VITAMIN D PO) Take 1 tablet by mouth daily.     Yes Historical Provider, MD  hydrochlorothiazide (HYDRODIURIL) 25 MG tablet Take 25 mg by mouth every other day.    Yes Historical Provider, MD  Multiple Vitamins-Minerals (ICAPS PO) Take 1 capsule by mouth 2 (two) times daily.    Yes Historical  Provider, MD  OXYGEN Inhale 3-4 L into the lungs continuous. 3 L 24/7- advised to increase to 4 L during exertion   Yes Historical Provider, MD  Tiotropium Bromide-Olodaterol (STIOLTO RESPIMAT) 2.5-2.5 MCG/ACT AERS Inhale 2 puffs into the lungs daily. 01/13/15  Yes Nyoka Cowden, MD  valsartan (DIOVAN) 320 MG tablet Take 320 mg by mouth daily.     Yes Historical Provider, MD    Past Medical History: Past Medical History:  Diagnosis Date  . Acute posthemorrhagic anemia   . Aortic stenosis   . Arthritis    End-stage arthritis of the left knee  . COPD (chronic obstructive pulmonary disease) (HCC)    COPD  . Dyspnea   . Emphysema   . H/O: hysterectomy    at age 48  . Heart attack (HCC) 03/07/2006   [place]ent, [place]  . Heart disease   . Hyperlipidemia   . Hypertension   . Hypokalemia    postoperative  . Hyponatremia    postoperative  . Ischemic heart disease   . Leukocytosis   . PVC's (premature ventricular contractions)     Past Surgical History: Past Surgical History:  Procedure Laterality Date  . BUNIONECTOMY  [id number]  . TOTAL KNEE ARTHROPLASTY  2001   right knee  . VARICOSE VEIN SURGERY     at age 37--Bilateral vein stripping    Family History: Family History  Problem Relation Age of Onset  . Heart disease Mother     myocardial infarction  . Hypertension Mother   . Hypertension Father   . Arthritis Father   . Heart disease Sister 10    myocardial infarction  . Hypertension Sister   . Cancer Sister     bone  . Cancer Sister     bone    Social History: Social History   Social History  . Marital status: Married    Spouse name: N/A  . Number of children: N/A  . Years of education: N/A   Social History Main Topics  . Smoking status: Former Smoker    Packs/day: 1.00    Years: 50.00    Types: Cigarettes    Quit date: 02/22/2006  . Smokeless tobacco: Never Used  . Alcohol use No  . Drug use: Unknown  . Sexual activity: Not Asked   Other Topics  Concern  . None   Social History Narrative  . None    Allergies:  Allergies  Allergen Reactions  . Promethazine Hcl Other (See Comments)    Drop in Blood pressure    Objective:    Vital Signs:   Temp:  [97.4 F (36.3 C)-98.1 F (36.7 C)] 97.4 F (36.3 C) (09/06 0523) Pulse Rate:  [44-113] 103 (09/06 0523) Resp:  [15-28] 15 (09/06 0523) BP: (119-175)/(76-104) 119/81 (  09/06 0523) SpO2:  [74 %-100 %] 94 % (09/06 0523) FiO2 (%):  [40 %-70 %] 45 % (09/06 0300) Weight:  [125 lb (56.7 kg)-132 lb 7.9 oz (60.1 kg)] 132 lb 7.9 oz (60.1 kg) (09/06 0523) Last BM Date:  (Patient reports BM this mornig)  Weight change: Filed Weights   10/28/15 1559 10/29/15 0523  Weight: 125 lb (56.7 kg) 132 lb 7.9 oz (60.1 kg)    Intake/Output:   Intake/Output Summary (Last 24 hours) at 10/29/15 0905 Last data filed at 10/29/15 0648  Gross per 24 hour  Intake          2353.75 ml  Output              275 ml  Net          2078.75 ml     Physical Exam: General:  Elderly. Frail. Dyspneic at rest.  HEENT: normal Neck: supple. JVP ~10. Carotids 2+ bilat; no bruits. No lymphadenopathy or thryomegaly appreciated. Cor: PMI nondisplaced. Tachy irregular rate & rhythm. 2/6 TR Lungs: clear on 6 liters oxygen  Abdomen: soft, nontender, + distended. No hepatosplenomegaly. No bruits or masses. Good bowel sounds. Extremities: no cyanosis, clubbing, rash, 1+ edema Neuro: alert & orientedx3, cranial nerves grossly intact. moves all 4 extremities w/o difficulty. Affect pleasant  Telemetry: Sinus Tach 120s  Labs: Basic Metabolic Panel:  Recent Labs Lab 10/28/15 1630 10/29/15 0240  NA 135 137  K 3.6 3.6  CL 101 98*  CO2 23 21*  GLUCOSE 107* 155*  BUN 20 18  CREATININE 0.93 1.02*  CALCIUM 10.6* 10.0  MG 1.7  --     Liver Function Tests: No results for input(s): AST, ALT, ALKPHOS, BILITOT, PROT, ALBUMIN in the last 168 hours. No results for input(s): LIPASE, AMYLASE in the last 168 hours. No  results for input(s): AMMONIA in the last 168 hours.  CBC:  Recent Labs Lab 10/28/15 1630 10/29/15 0240  WBC 12.7* 10.1  NEUTROABS 9.6*  --   HGB 16.2* 15.7*  HCT 46.6* 45.2  MCV 97.5 98.5  PLT 243 234    Cardiac Enzymes: No results for input(s): CKTOTAL, CKMB, CKMBINDEX, TROPONINI in the last 168 hours.  BNP: BNP (last 3 results)  Recent Labs  10/08/15 1354 10/28/15 1623  BNP 1,305.8* 1,267.9*    ProBNP (last 3 results)  Recent Labs  01/13/15 1716  PROBNP 327.0*     CBG: No results for input(s): GLUCAP in the last 168 hours.  Coagulation Studies:  Recent Labs  10/28/15 1738  LABPROT 13.5  INR 1.02    Other results: EKG: Sinus Rhythm PVCs 90s   Imaging: Ct Angio Chest Pe W And/or Wo Contrast  Result Date: 10/28/2015 CLINICAL DATA:  Progressive shortness of breath. Right heart failure. Hypertension. COPD EXAM: CT ANGIOGRAPHY CHEST WITH CONTRAST TECHNIQUE: Multidetector CT imaging of the chest was performed using the standard protocol during bolus administration of intravenous contrast. Multiplanar CT image reconstructions and MIPs were obtained to evaluate the vascular anatomy. CONTRAST:  100 mL Isovue 370 IV COMPARISON:  Chest x-ray 10/28/2015.  Chest CT 06/10/2010 FINDINGS: Negative for pulmonary embolism. Pulmonary artery enlargement compatible with pulmonary artery hypertension. Main pulmonary artery 33 mm in diameter. Right pulmonary artery 31 mm in diameter. Left pulmonary artery 33 mm in diameter. Pulmonary artery enlargement has progressed since 2012. Extensive coronary calcification. Right heart strain. RV/ LV ratio 2.5. Reflux of contrast into the IVC and hepatic veins due to elevated right heart pressure. No pericardial  effusion. COPD and emphysema which is advanced. Mild atelectasis in the right lower lobe. Negative for pneumonia. Right lower peritracheal lymph node 13 mm. Multiple anterior mediastinal lymph nodes measuring up to 15 mm. These were  present previously and show mild progression. Left hilar lymph nodes also show mild progression in size. Subcarinal lymph node measures 2.7 cm with progression from the prior study. Mild right hilar lymph lymphadenopathy with progression. 6 x 11 mm right posterior upper lobe nodule axial image 71. This shows mild progression since the prior study when it measured approximately 4 x 7 mm. 5 mm nodular thickening along the major fissure on the right axial image 85 likely a lymph node. Multiple small gallstones.  4 cm right renal cyst. Review of the MIP images confirms the above findings. IMPRESSION: Negative for pulmonary embolism Pulmonary artery enlargement with pulmonary artery hypertension right heart strain. Severe emphysema Mediastinal and bi hilar adenopathy has progressed since 2012. These may be reactive lymph nodes. Neoplasm and infection are also considerations. Consider one of the following in 3 months for both low-risk and high-risk individuals: (a) repeat chest CT, (b) follow-up PET-CT, or (c) tissue sampling. This recommendation follows the consensus statement: Guidelines for Management of Incidental Pulmonary Nodules Detected on CT Images:From the Fleischner Society 2017; published online before print (10.1148/[id number]). Posterior Right upper lobe nodule has progressed since the prior study measuring approximately 6 x 11 mm. Electronically Signed   By: Marlan Palauharles  Clark M.D.   On: 10/28/2015 19:14   Dg Chest Port 1 View  Result Date: 10/28/2015 CLINICAL DATA:  Chest pain short of breath. EXAM: PORTABLE CHEST 1 VIEW COMPARISON:  10/10/2015 FINDINGS: Normal cardiac silhouette. Lungs are mildly hyperinflated. There is mild chronic bronchitic markings at the bases. Emphysematous change in the upper lobes. Atherosclerotic calcification of the aorta. IMPRESSION: Upper lobe emphysema.  No acute findings. Atherosclerotic calcification of the aorta. Electronically Signed   By: Genevive BiStewart  Edmunds M.D.    On: 10/28/2015 17:08      Medications:     Current Medications: . amLODipine  5 mg Oral Daily  . aspirin  325 mg Oral Daily  . heparin subcutaneous  5,000 Units Subcutaneous Q8H  . ipratropium-albuterol  3 mL Nebulization Q6H  . irbesartan  75 mg Oral Daily  . methylPREDNISolone (SOLU-MEDROL) injection  60 mg Intravenous TID  . mometasone-formoterol  2 puff Inhalation BID  . nebivolol  10 mg Oral Daily  . sodium chloride flush  3 mL Intravenous Q12H     Infusions: . sodium chloride 1,000 mL (10/28/15 2241)      Assessment:  1. Acute/Chronic Hypoxic Respiratory Failure 2. End Stage Empysema 3. Severe PAH secondary to emphysema 4. PVCs 5. Acute/Chronic Diastolic Heart Failure 6.  CAD- stent 2009 7. Hyperlipidemia 8. Aortic Stenosis- Mild on ECHO 9. Sinus Tach  10. DNR   Plan/Discussion:    Admitted with A/C hypoxic respiratory failure and severely elevated pulmonary pressures. Progressive decline over the last year with increasing dyspnea. ECHO/CT reviewed by Dr Heidi RomneyBensimhon. Severe PAH noted on ECHO and severe emphysema noted on CT. No role for RHC as vasodilators will not improve clinical picture. Continue supplemental oxygen to maintain oxygen saturations.   Tachycardic with PVCs. May need short term amio but not good option with lung disease.   Currently on nevibilol.  Mild volume overload noted. BNP 1267. Give dose 40 mg IV lasix. Renal function stable.   She is DNR and will likely need Hospice. Would benefit from Palliative Care  consult.  Length of Stay: 1  Amy Clegg NP-C  10/29/2015, 9:05 AM  Advanced Heart Failure Team Pager [phone] (M-F; 7a - 4p)  Please contact [facility] for night-coverage after hours (4p -7a ) and weekends on [email or web address]  Patient seen and examined with Tonye Becket, NP. We discussed all aspects of the encounter. I agree with the assessment and plan as stated above.   CT and echo images reviewed personally. She has severe emphysema  on CT. Echo with evidence of significant right heart strain. In some windows pulmonary pressures estimated as high as HG but this may be overestimated. There is also at least moderate AS.  There is no evidence of significantly elevated left heart pressures. Thus, she appears to have end-stage PAH/cor pulmonale in setting of severe COPD (WHO Group III). We discussed possible RHC to exactly measure pressures but I do not think this will change management any.  The only therapy in this group is to support her oxygenation. Pulmonary vasodilators will only make her worse. Long discussion with her and her husband about her situation and poor prognosis. Suggest Hospice. We wil diurese gently to see if this will help her symptoms some. Watch BP closely as she is preload dependent.   Bensimhon, Daniel,MD 10:31 PM

## 2015-10-29 NOTE — Progress Notes (Signed)
Rt removed bipap at this time to a 4lpm [place] and pt is satting at 94% with no WOB noted.  Pt is alert and oriented and is tolerating the [place] well.  Rt will continue to monitor for need of more o2 or use of bipap.

## 2015-10-30 LAB — MAGNESIUM: MAGNESIUM: 2.5 mg/dL — AB (ref 1.7–2.4)

## 2015-10-30 LAB — BASIC METABOLIC PANEL
Anion gap: 10 (ref 5–15)
BUN: 22 mg/dL — AB (ref 6–20)
CALCIUM: 10.2 mg/dL (ref 8.9–10.3)
CHLORIDE: 105 mmol/L (ref 101–111)
CO2: 21 mmol/L — AB (ref 22–32)
CREATININE: 0.97 mg/dL (ref 0.44–1.00)
GFR calc Af Amer: >60 mL/min (ref >60)
GFR calc non Af Amer: 53 mL/min — ABNORMAL LOW (ref >60)
GLUCOSE: 130 mg/dL — AB (ref 65–99)
Potassium: 4.4 mmol/L (ref 3.5–5.1)
Sodium: 136 mmol/L (ref 135–145)

## 2015-10-30 LAB — GLUCOSE, CAPILLARY
GLUCOSE-CAPILLARY: 141 mg/dL — AB (ref 65–99)
Glucose-Capillary: 149 mg/dL — ABNORMAL HIGH (ref 65–99)

## 2015-10-30 MED ORDER — POTASSIUM CHLORIDE CRYS ER 20 MEQ PO TBCR
40.0000 meq | EXTENDED_RELEASE_TABLET | Freq: Once | ORAL | Status: AC
  Administered 2015-10-30: 40 meq via ORAL
  Filled 2015-10-30: qty 2

## 2015-10-30 MED ORDER — POTASSIUM CHLORIDE CRYS ER 20 MEQ PO TBCR
40.0000 meq | EXTENDED_RELEASE_TABLET | Freq: Every day | ORAL | Status: DC
  Administered 2015-10-30: 40 meq via ORAL
  Filled 2015-10-30: qty 2

## 2015-10-30 MED ORDER — FUROSEMIDE 40 MG PO TABS: 40.0000 mg | ORAL_TABLET | Freq: Every day | ORAL | Status: AC

## 2015-10-30 MED ORDER — SENNA 8.6 MG PO TABS: 1.0000 | ORAL_TABLET | Freq: Every day | ORAL | Status: DC

## 2015-10-30 MED ORDER — POLYETHYLENE GLYCOL 3350 17 G PO PACK
17.0000 g | PACK | Freq: Two times a day (BID) | ORAL | Status: DC
  Administered 2015-10-30: 17 g via ORAL
  Filled 2015-10-30: qty 1

## 2015-10-30 MED ORDER — ALBUTEROL SULFATE (2.5 MG/3ML) 0.083% IN NEBU: 2.5000 mg | INHALATION_SOLUTION | RESPIRATORY_TRACT | 12 refills | Status: AC | PRN

## 2015-10-30 MED ORDER — FUROSEMIDE 40 MG PO TABS
40.0000 mg | ORAL_TABLET | Freq: Every day | ORAL | Status: DC
  Administered 2015-10-30: 40 mg via ORAL
  Filled 2015-10-30: qty 1

## 2015-10-30 MED ORDER — BISACODYL 10 MG RE SUPP
10.0000 mg | Freq: Every day | RECTAL | Status: DC
  Filled 2015-10-30: qty 1

## 2015-10-30 NOTE — Progress Notes (Signed)
[facility] Liaison: ? Received a request from [place] for family interest in [facility], with request to transfer today. Chart reviewed. Met with patient and husband, Dr. Raynelle Jan to confirm interest and explain services. Family agreeable to transfer today. CSW aware and transportation scheduled. Registration paperwork completed. ? Please fax discharge summary to [phone]. ? RN please call report to [phone]. ? Thank You, Freddi Starr RN, [facility] Liaison [phone]

## 2015-10-30 NOTE — Progress Notes (Signed)
Daily Progress Note   Patient Name: Heidi Morales       Date: 10/30/2015 DOB: 05-06-1930  Age: 82 y.o. MRN#: [id number] Attending Physician: Leroy Sea, MD Primary Care Physician: Minda Meo, MD Admit Date: 10/28/2015  Reason for Consultation/Follow-up: Establishing goals of care and Hospice Evaluation  Subjective: Visited patient and husband at bedside. Discussed symptom management. Patient she was able to rest last night. She did have shortness of breath during the night but states she felt better after taking the morphine. Husband states her oxygen was recently increased from 4L to 6L. Patient having dyspnea at rest and pursed lip breathing but breathing is less labored than yesterday. She states she has not had a bowel movement in two days and feels like she could today. Discussed residential hospice with patient and husband. As much as he wishes to take her home, he knows this is not possible because they have a large two story house and he cannot care for her himself. Patient and family agree with residential hospice and familiar with [facility]. Patient is more tearful this morning stating "sometimes it is hard for me to fathom you are talking about me." Husband is a retired Librarian, academic and very supportive. Offered emotional support.  Length of Stay: 2  Current Medications: Scheduled Meds:  . amLODipine  5 mg Oral Daily  . aspirin  325 mg Oral Daily  . furosemide  40 mg Oral Daily  . heparin subcutaneous  5,000 Units Subcutaneous Q8H  . ipratropium-albuterol  3 mL Nebulization TID  . methylPREDNISolone (SOLU-MEDROL) injection  40 mg Intravenous Q12H  . mometasone-formoterol  2 puff Inhalation BID  . polyethylene glycol  17 g Oral BID  . potassium chloride  40 mEq Oral Daily  .  senna  1 tablet Oral QHS  . sodium chloride flush  3 mL Intravenous Q12H    Continuous Infusions:    PRN Meds: albuterol, bisacodyl, morphine, ondansetron **OR** ondansetron (ZOFRAN) IV, sodium chloride  Physical Exam  Constitutional: She is oriented to person, place, and time. She is cooperative.  Cardiovascular: An irregular rhythm present.  Pulmonary/Chest: Accessory muscle usage present. Tachypnea noted. She has decreased breath sounds.  Pursed lip breathing and dyspnea at rest.   Abdominal: Soft. Bowel sounds are normal.  Neurological: She is alert and oriented to  person, place, and time.  Skin: Skin is warm and dry.  Psychiatric: She has a normal mood and affect. Her speech is normal and behavior is normal. Thought content normal. Cognition and memory are normal.  Nursing note and vitals reviewed.           Vital Signs: BP 123/77 (BP Location: Left Arm)   Pulse (!) 116   Temp 98.1 F (36.7 C)   Resp (!) 27   Ht 5\' 2"  (1.575 m)   Wt 60.2 kg (132 lb 11.5 oz)   SpO2 97%   BMI 24.27 kg/m  SpO2: SpO2: 97 % O2 Device: O2 Device: Nasal Cannula O2 Flow Rate: O2 Flow Rate (L/min): 4 L/min  Intake/output summary:  Intake/Output Summary (Last 24 hours) at 10/30/15 0930 Last data filed at 10/29/15 1804  Gross per 24 hour  Intake             1140 ml  Output              375 ml  Net              765 ml   LBM: Last BM Date: 10/28/15 Baseline Weight: Weight: 56.7 kg (125 lb) Most recent weight: Weight: 60.2 kg (132 lb 11.5 oz)       Palliative Assessment/Data: PPS 30%   Flowsheet Rows   Flowsheet Row Most Recent Value  Intake Tab  Referral Department  Cardiology  Unit at Time of Referral  Intermediate Care Unit  Palliative Care Primary Diagnosis  Cardiac  Date Notified  10/29/15  Palliative Care Type  New Palliative care  Reason for referral  Clarify Goals of Care  Date of Admission  10/28/15  # of days IP prior to Palliative referral  1  Clinical Assessment    Palliative Performance Scale Score  30%  Psychosocial & Spiritual Assessment  Palliative Care Outcomes      Patient Active Problem List   Diagnosis Date Noted  . Sinus tachycardia (HCC) 10/29/2015  . Palliative care encounter   . Encounter for hospice care discussion   . Acute respiratory failure (HCC) 10/28/2015  . Acute and chronic respiratory failure with hypercapnia (HCC) 10/28/2015  . Cor pulmonale (HCC)   . Pulmonary hypertension (HCC)   . COPD GOLD I with predominant emphysema/ chronic 02 dep  01/13/2015  . Dyspnea 01/13/2015  . Acute on chronic respiratory failure with hypoxemia (HCC) 01/13/2015  . Aortic stenosis 09/12/2013  . Essential hypertension 09/12/2013  . Abnormal EKG 09/12/2013  . Nonexudative age-related macular degeneration 06/08/2012  . History of surgical procedure 06/08/2012  . Angioid streak 10/29/2011  . Error, refractive, myopia 10/29/2011  . Degeneration macular 04/09/2011  . CAD (coronary artery disease) 09/08/2010  . Hyperlipidemia 09/08/2010    Palliative Care Assessment & Plan   Patient Profile: 82 y.o. female  with past medical history of acute on chronic respiratory failure, end stage COPD with 3L home oxygen, emphysema, hypertension, hyperlipidemia, heart disease, myocardial infarction, aortic stenosis, arthritis, admitted on 10/28/2015 from her cardiologist's office after echo was found to have severe pulmonary hypertension. PE ruled out. Required non-rebreather mask and Bipap for short time upon admission. Cardiology consulted. Patient receiving lasix but would not benefit from right heart catheterization or vasodilators. Palliative Medicine consultation for goals of care regarding end stage emphysema and pulmonary hypertension.   Assessment: Acute on chronic respiratory failure End stage COPD/emphysema Sever pulmonary hypertension Acute on chronic diastolic heart failure  Recommendations/Plan:  DNR/DNI per patient.  Continue morphine  2.5mg  PO q3h prn dyspnea/pain.  Agree with Miralax 17g daily. Added Senna 1tab PO HS. Dulcolax suppository if necessary.   Offered chaplain-declined. Family has contacted their priest.  Disposition: residential hospice. Patient and family requesting [facility]. SW consulted.   Code Status: DNR   Code Status Orders        Start     Ordered   10/28/15 2031  Do not attempt resuscitation (DNR)  Continuous    Question Answer Comment  In the event of cardiac or respiratory ARREST Do not call a "code blue"   In the event of cardiac or respiratory ARREST Do not perform Intubation, CPR, defibrillation or ACLS   In the event of cardiac or respiratory ARREST Use medication by any route, position, wound care, and other measures to relive pain and suffering. May use oxygen, suction and manual treatment of airway obstruction as needed for comfort.      10/28/15 2030    Code Status History    Date Active Date Inactive Code Status Order ID Comments User Context   This patient has a current code status but no historical code status.       Prognosis:   < 2 weeks-in the setting of end stage COPD/empysema, severe pulmonary hypertension, acute on chronic diastolic heart failure, and functional decline.   Discharge Planning:  Hospice facility-[facility]  Care plan was discussed with Patient, husband, RN, and SW  Thank you for allowing the Palliative Medicine Team to assist in the care of this patient.   Time In: 0830 Time Out: 0905 Total Time Prolonged Time Billed  no       Greater than 50%  of this time was spent counseling and coordinating care related to the above assessment and plan.  Vennie Homans, FNP-C Palliative Medicine Team  Phone: [phone] Fax: [phone]  Addendum: Agree with above, patient seen and examined Discussed in detail with husband Dr Bunnie Pion All questions answered, goals are comfort, residential hospice on d/c  Rosalin Hawking MD [place]  palliative medicine team [phone] Please contact Palliative Medicine Team phone at [phone] for questions and concerns.

## 2015-10-30 NOTE — Discharge Summary (Signed)
Heidi Morales [id number] DOB: 02/03/1931 DOA: 10/28/2015  PCP: Minda MeoARONSON,RICHARD A, MD  Admit date: 10/28/2015  Discharge date: 10/30/2015  Admitted From: Home   Disposition:  Residential Hospice   Recommendations for Outpatient Follow-up:   Follow up with PCP in 1-2 weeks  PCP Please obtain BMP/CBC, 2 view CXR in 1week,  (see Discharge instructions)   PCP Please follow up on the following pending results: None   Home Health: None   Equipment/Devices: None  Consultations: Pall Care, PCCM, Cards Discharge Condition: Guarded   CODE STATUS: DNR   Diet Recommendation: Heart Healthy   Chief Complaint  Patient presents with  . Shortness of Breath     Brief history of present illness from the day of admission and additional interim summary    AnneBodneris a 82 y.o.female,With history of advanced COPD primarily emphysema on 3 L nasal cannula oxygen follows with Dr. Sharee PimpleWirt, CAD Follows with Dr. Allyson SabalBerry, essential hypertension, dyslipidemia, arthritis, Mild aortic stenosis Was been experiencing gradually progressive shortness of breath for The last 1 year, now short of breath to the extent that she cannot even ambulate 10 feet, denies any significant orthopnea, no leg edema or swelling, no weight gain, denies any personal or family history of blood clots, No recent travels on long drives, denies any fever or chills or productive cough, no significant wheezing, She today went to see her cardiologist where echocardiogram was done and was found to have severe pulmonary hypertension with pulmonary artery pressure of 120 with reduced RV function, echo in 2015 showed normal RV function, she was sent to the ER.   In the ER she was found to be in acute on chronic hypoxic respiratory failure requiring nonrebreather mask versus 10  L and is on nasal cannula oxygen, I was called to admit. Her initial blood work, EKG and chest x-ray were unremarkable.  Hospital issues addressed     1. Acute on chronic hypoxic respiratory failure in a patient with COPD on 3 L nasal cannula oxygen. I do not hear any wheezing, no productive cough or fever, chest x-ray unremarkable, Echo done and cardiology office shows severely reduced RV function with severe pulmonary hypertension with a normal RV function 2 years ago.  Patient was severely hypoxic, she has cor pulmonale from secondary pulmonary hypertension from underlying COPD, CT angiogram done which rules out PE, She was seen by both pulmonary and cardiology, unfortunately nothing more to offer and she was deemed terminal, seen by palliative care now DO NOT RESUSCITATE attending discharged to residential hospice goal of care being comfort only at this time.  2. COPD. no signs of exacerbation, supportive care only with oxygen and nebulizer treatments, she is requiring between 6-8 L of nasal cannula oxygen at this time.  3. Chronic diastolic CHF. EF preserved at 55%, no rales on exam, no edema, BNP elevated due to right-sided failure. Treatment as in #1 above. Cardiology to follow. No edema on exam. Gentle Lasix per PCCM.  4. Mild AS. Stable no acute issues.  5.History  of CAD. No acute issues chest pain free, Continue aspirin and beta blocker for secondary prevention.   Discharge diagnosis     Principal Problem:   Acute respiratory failure (HCC) Active Problems:   CAD (coronary artery disease)   Aortic stenosis   Essential hypertension   COPD GOLD I with predominant emphysema/ chronic 02 dep    Acute and chronic respiratory failure with hypercapnia (HCC)   Sinus tachycardia (HCC)   Palliative care encounter   Encounter for hospice care discussion    Discharge instructions    Discharge Instructions    Diet - low sodium heart healthy    Complete by:  As directed    Discharge instructions    Complete by:  As directed   Disposition. Residential hospice Condition guarded Goal of care comfort Diet heart healthy as tolerated Activity with assistance and fall precautions as tolerated.      Discharge Medications     Medication List    STOP taking these medications   hydrochlorothiazide 25 MG tablet Commonly known as:  HYDRODIURIL   valsartan 320 MG tablet Commonly known as:  DIOVAN     TAKE these medications   albuterol 108 (90 Base) MCG/ACT inhaler Commonly known as:  PROAIR HFA 2 pffs every 4 hours as needed What changed:  how much to take  how to take this  when to take this  reasons to take this  additional instructions   albuterol (2.5 MG/3ML) 0.083% nebulizer solution Commonly known as:  PROVENTIL Take 3 mLs (2.5 mg total) by nebulization every 2 (two) hours as needed for wheezing or shortness of breath. What changed:  You were already taking a medication with the same name, and this prescription was added. Make sure you understand how and when to take each.   amLODipine 5 MG tablet Commonly known as:  NORVASC Take 5 mg by mouth daily.   aspirin 325 MG tablet Take 325 mg by mouth daily.   BYSTOLIC 10 MG tablet Generic drug:  nebivolol Take 10 mg by mouth daily.   calcium citrate-vitamin D 315-200 MG-UNIT tablet Commonly known as:  CITRACAL+D Take 1 tablet by mouth daily.   furosemide 40 MG tablet Commonly known as:  LASIX Take 1 tablet (40 mg total) by mouth daily.   ICAPS PO Take 1 capsule by mouth 2 (two) times daily.   OXYGEN Inhale 3-4 L into the lungs continuous. 3 L 24/7- advised to increase to 4 L during exertion   Tiotropium Bromide-Olodaterol 2.5-2.5 MCG/ACT Aers Commonly known as:  STIOLTO RESPIMAT Inhale 2 puffs into the lungs daily.   VITAMIN D PO Take 1 tablet by mouth daily.         Major procedures and Radiology Reports - PLEASE review detailed and final reports thoroughly  -      CTA Chest - No PE, Pulm HTN, lymphadenopathy  TTE - Normal LV systolic function; grade 1 diastolic dysfunction; D shaped septum; calcified aortic valve with fixed noncoronary cusp and mild AS; mild LAE, RAE and RVE; severely reduced RV function; mild TR with severely elevated pulmonary pressure (estimated PASP120).    Dg Chest 2 View  Result Date: 10/10/2015 CLINICAL DATA:  COPD EXAM: CHEST  2 VIEW COMPARISON:  January 13, 2015 FINDINGS: The heart size remains normal. The hila and mediastinum are unchanged. Atherosclerosis seen in the thoracic aorta. No pulmonary nodules or masses. Mild coarsening of lung markings in the lung bases is stable. No other acute abnormalities. IMPRESSION: No acute  abnormalities. Coarsening of lung markings in the bases may be due to scarring or chronic bronchitic change given history. There have been no acute interval changes. Electronically Signed   By: Gerome Sam III M.D   On: 10/10/2015 14:06   Ct Angio Chest Pe W And/or Wo Contrast  Result Date: 10/28/2015 CLINICAL DATA:  Progressive shortness of breath. Right heart failure. Hypertension. COPD EXAM: CT ANGIOGRAPHY CHEST WITH CONTRAST TECHNIQUE: Multidetector CT imaging of the chest was performed using the standard protocol during bolus administration of intravenous contrast. Multiplanar CT image reconstructions and MIPs were obtained to evaluate the vascular anatomy. CONTRAST:  100 mL Isovue 370 IV COMPARISON:  Chest x-ray 10/28/2015.  Chest CT 06/10/2010 FINDINGS: Negative for pulmonary embolism. Pulmonary artery enlargement compatible with pulmonary artery hypertension. Main pulmonary artery 33 mm in diameter. Right pulmonary artery 31 mm in diameter. Left pulmonary artery 33 mm in diameter. Pulmonary artery enlargement has progressed since 2012. Extensive coronary calcification. Right heart strain. RV/ LV ratio 2.5. Reflux of contrast into the IVC and hepatic veins due to elevated right heart pressure. No  pericardial effusion. COPD and emphysema which is advanced. Mild atelectasis in the right lower lobe. Negative for pneumonia. Right lower peritracheal lymph node 13 mm. Multiple anterior mediastinal lymph nodes measuring up to 15 mm. These were present previously and show mild progression. Left hilar lymph nodes also show mild progression in size. Subcarinal lymph node measures 2.7 cm with progression from the prior study. Mild right hilar lymph lymphadenopathy with progression. 6 x 11 mm right posterior upper lobe nodule axial image 71. This shows mild progression since the prior study when it measured approximately 4 x 7 mm. 5 mm nodular thickening along the major fissure on the right axial image 85 likely a lymph node. Multiple small gallstones.  4 cm right renal cyst. Review of the MIP images confirms the above findings. IMPRESSION: Negative for pulmonary embolism Pulmonary artery enlargement with pulmonary artery hypertension right heart strain. Severe emphysema Mediastinal and bi hilar adenopathy has progressed since 2012. These may be reactive lymph nodes. Neoplasm and infection are also considerations. Consider one of the following in 3 months for both low-risk and high-risk individuals: (a) repeat chest CT, (b) follow-up PET-CT, or (c) tissue sampling. This recommendation follows the consensus statement: Guidelines for Management of Incidental Pulmonary Nodules Detected on CT Images:From the Fleischner Society 2017; published online before print (10.1148/radiol.1610960454). Posterior Right upper lobe nodule has progressed since the prior study measuring approximately 6 x 11 mm. Electronically Signed   By: Marlan Palau M.D.   On: 10/28/2015 19:14   Dg Chest Port 1 View  Result Date: 10/28/2015 CLINICAL DATA:  Chest pain short of breath. EXAM: PORTABLE CHEST 1 VIEW COMPARISON:  10/10/2015 FINDINGS: Normal cardiac silhouette. Lungs are mildly hyperinflated. There is mild chronic bronchitic markings at the  bases. Emphysematous change in the upper lobes. Atherosclerotic calcification of the aorta. IMPRESSION: Upper lobe emphysema.  No acute findings. Atherosclerotic calcification of the aorta. Electronically Signed   By: Genevive Bi M.D.   On: 10/28/2015 17:08    Micro Results    Recent Results (from the past 240 hour(s))  Culture, group A strep     Status: None (Preliminary result)   Collection Time: 10/28/15  8:27 PM  Result Value Ref Range Status   Specimen Description THROAT  Final   Special Requests NONE  Final   Culture TOO YOUNG TO READ  Final   Report Status PENDING  Incomplete  Today   Subjective    Berlie Persky today has no headache, no chest abdominal pain,no new weakness tingling or numbness, still SOB.    Objective   Blood pressure 129/80, pulse (!) 47, temperature 97.4 F (36.3 C), temperature source Oral, resp. rate (!) 22, height 5\' 2"  (1.575 m), weight 60.2 kg (132 lb 11.5 oz), SpO2 97 %.   Intake/Output Summary (Last 24 hours) at 10/30/15 1154 Last data filed at 10/30/15 0951  Gross per 24 hour  Intake              483 ml  Output              250 ml  Net              233 ml    Exam Awake Alert, Oriented x 3, No new F.N deficits, Normal affect [place].AT,PERRAL Supple Neck,No JVD, No cervical lymphadenopathy appriciated.  Symmetrical Chest wall movement, Good air movement bilaterally, CTAB RRR,No Gallops,Rubs or new Murmurs, No Parasternal Heave +ve B.Sounds, Abd Soft, Non tender, No organomegaly appriciated, No rebound -guarding or rigidity. No Cyanosis, Clubbing or edema, No new Rash or bruise   Data Review   CBC w Diff: Lab Results  Component Value Date   WBC 10.1 10/29/2015   HGB 15.7 (H) 10/29/2015   HCT 45.2 10/29/2015   PLT 234 10/29/2015   LYMPHOPCT 17 10/28/2015   MONOPCT 6 10/28/2015   EOSPCT 2 10/28/2015   BASOPCT 0 10/28/2015    CMP: Lab Results  Component Value Date   NA 136 10/30/2015   K 4.4 10/30/2015   CL 105 10/30/2015     CO2 21 (L) 10/30/2015   BUN 22 (H) 10/30/2015   CREATININE 0.97 10/30/2015   CREATININE 0.84 10/08/2015   PROT 6.6 10/07/2014   ALBUMIN 3.9 10/07/2014   BILITOT 0.6 10/07/2014   ALKPHOS 97 10/07/2014   AST 13 10/07/2014   ALT 10 10/07/2014  .   Total Time in preparing paper work, data evaluation and todays exam - 35 minutes  Leroy Sea M.D on 10/30/2015 at 11:54 AM  [facility]   Office  [phone]

## 2015-10-30 NOTE — Progress Notes (Signed)
[facility] Anticipated DC date: 10/30/15 Family notified: Spouse at bedside Transport by: Sharin MonsPTAR   Per MD patient ready for DC to Hospice. RN, patient, patient's family, and facility notified of DC. RN given number for report. DC packet on chart. Ambulance transport requested for patient.   CSW signing off.  Cristobal GoldmannNadia Andie Mortimer, [place] Clinical Social Worker [phone]

## 2015-10-30 NOTE — Clinical Social Work Note (Addendum)
Clinical Social Work Assessment  Patient Details  Name: Heidi Morales MRN: [id number] Date of Birth: 06/06/1930  Date of referral:  10/30/15               Reason for consult:  End of Life/Hospice                Permission sought to share information with:  Facility Medical sales representativeContact Representative, Family Supports Permission granted to share information::  Yes, Verbal Permission Granted  Name::     Chrissie NoaWilliam  Agency::  Hospice  Relationship::  Spouse  Contact Information:  [phone]  Housing/Transportation Living arrangements for the past 2 months:  Single Family Home Source of Information:  Patient, Spouse Patient Interpreter Needed:  None Criminal Activity/Legal Involvement Pertinent to Current Situation/Hospitalization:  No - Comment as needed Significant Relationships:  Adult Children, Spouse Lives with:  Spouse Do you feel safe going back to the place where you live?  No Need for family participation in patient care:  Yes (Comment)  Care giving concerns:  CSW received consult regarding residential hospice placement. CSW spoke with patient and patient's husband at bedside. [facility] and would prefer [facility] there since husband cannot provide care for patient at home. CSW to continue to follow for needs.   Social Worker assessment / plan:  CSW spoke with patient regarding residential hospice placement.   Employment status:  Retired Health and safety inspectornsurance information:  Medicare PT Recommendations:  Home with Home Health Information / Referral to community resources:  Other (Comment Required) (Residential Hospice)  Patient/Family's Response to care:  Patient expressed understanding of her current health needs and [facility]. [facility] due to having friends there volunteering.   Patient/Family's Understanding of and Emotional Response to Diagnosis, Current Treatment, and Prognosis:  No questions/concerns expressed at this time, though  patient expresses sadness regarding end of life. CSW provided emotional support.  Emotional Assessment Appearance:  Appears stated age Attitude/Demeanor/Rapport:  Other (Appropriate) Affect (typically observed):  Accepting, Appropriate Orientation:  Oriented to Self, Oriented to Situation, Oriented to  Time, Oriented to Place Alcohol / Substance use:  Not Applicable Psych involvement (Current and /or in the community):  No (Comment)  Discharge Needs  Concerns to be addressed:  Care Coordination Readmission within the last 30 days:  No Current discharge risk:  None Barriers to Discharge:  Continued Medical Work up   Ingram Micro Incadia S Mclane Arora, LCSWA 10/30/2015, 9:07 AM

## 2015-10-30 NOTE — Consult Note (Signed)
   [facility]   10/30/2015  Heidi Morales 10/23/1930 [id number]   [facility] for services for history of CHF and COPD. Chart reviewed. Noted discharge plan is for residential hospice. [facility] not appropriate at this time.   Raiford NobleAtika Hall, MSN-Ed, RN,BSN [facility] Hospital Liaison [phone]

## 2015-10-30 NOTE — Progress Notes (Signed)
CSW received consult regarding Residential Hospice Placement. CSW sent referral to [facility] (patient's first choice) for assessment.  Osborne Cascoadia Pixie Burgener LCSWA [phone]

## 2015-10-30 NOTE — Progress Notes (Signed)
Advanced Heart Failure Rounding Note   Subjective:    Resting comfortable. Breathing slightly improved with diuresis.   In ventricular bigeminy this am     Objective:   Weight Range:  Vital Signs:   Temp:  [97.6 F (36.4 C)-98.1 F (36.7 C)] 98.1 F (36.7 C) (09/06 2215) Pulse Rate:  [109-116] 116 (09/06 1801) Resp:  [22-27] 27 (09/06 1801) BP: (123-147)/(77-93) 123/77 (09/06 1344) SpO2:  [93 %-97 %] 97 % (09/06 2130) Weight:  [60.2 kg (132 lb 11.5 oz)] 60.2 kg (132 lb 11.5 oz) (09/07 0325) Last BM Date: 10/28/15  Weight change: Filed Weights   10/28/15 1559 10/29/15 0523 10/30/15 0325  Weight: 56.7 kg (125 lb) 60.1 kg (132 lb 7.9 oz) 60.2 kg (132 lb 11.5 oz)    Intake/Output:   Intake/Output Summary (Last 24 hours) at 10/30/15 0540 Last data filed at 10/29/15 1804  Gross per 24 hour  Intake             1140 ml  Output              500 ml  Net              640 ml     Physical Exam: General:  Elderly. Frail. Mildly dyspneic HEENT: normal Neck: supple. JVP ~10. Carotids 2+ bilat; no bruits. No lymphadenopathy or thryomegaly appreciated. Cor: PMI nondisplaced. irregular rate & rhythm. 2/6 TR Lungs: clear on 6 liters oxygen  Abdomen: soft, nontender, + distended. No hepatosplenomegaly. No bruits or masses. Good bowel sounds. Extremities: no cyanosis, clubbing, rash, trace edema Neuro: alert & orientedx3, cranial nerves grossly intact. moves all 4 extremities w/o difficulty. Affect pleasant  Telemetry: NSR with ventricular bigeminy 90s  Labs: Basic Metabolic Panel:  Recent Labs Lab 10/28/15 1630 10/29/15 0240  NA 135 137  K 3.6 3.6  CL 101 98*  CO2 23 21*  GLUCOSE 107* 155*  BUN 20 18  CREATININE 0.93 1.02*  CALCIUM 10.6* 10.0  MG 1.7  --     Liver Function Tests: No results for input(s): AST, ALT, ALKPHOS, BILITOT, PROT, ALBUMIN in the last 168 hours. No results for input(s): LIPASE, AMYLASE in the last 168 hours. No results for input(s):  AMMONIA in the last 168 hours.  CBC:  Recent Labs Lab 10/28/15 1630 10/29/15 0240  WBC 12.7* 10.1  NEUTROABS 9.6*  --   HGB 16.2* 15.7*  HCT 46.6* 45.2  MCV 97.5 98.5  PLT 243 234    Cardiac Enzymes: No results for input(s): CKTOTAL, CKMB, CKMBINDEX, TROPONINI in the last 168 hours.  BNP: BNP (last 3 results)  Recent Labs  10/08/15 1354 10/28/15 1623  BNP 1,305.8* 1,267.9*    ProBNP (last 3 results)  Recent Labs  01/13/15 1716  PROBNP 327.0*      Other results:  Imaging: Ct Angio Chest Pe W And/or Wo Contrast  Result Date: 10/28/2015 CLINICAL DATA:  Progressive shortness of breath. Right heart failure. Hypertension. COPD EXAM: CT ANGIOGRAPHY CHEST WITH CONTRAST TECHNIQUE: Multidetector CT imaging of the chest was performed using the standard protocol during bolus administration of intravenous contrast. Multiplanar CT image reconstructions and MIPs were obtained to evaluate the vascular anatomy. CONTRAST:  100 mL Isovue 370 IV COMPARISON:  Chest x-ray 10/28/2015.  Chest CT 06/10/2010 FINDINGS: Negative for pulmonary embolism. Pulmonary artery enlargement compatible with pulmonary artery hypertension. Main pulmonary artery 33 mm in diameter. Right pulmonary artery 31 mm in diameter. Left pulmonary artery 33 mm in diameter.  Pulmonary artery enlargement has progressed since 2012. Extensive coronary calcification. Right heart strain. RV/ LV ratio 2.5. Reflux of contrast into the IVC and hepatic veins due to elevated right heart pressure. No pericardial effusion. COPD and emphysema which is advanced. Mild atelectasis in the right lower lobe. Negative for pneumonia. Right lower peritracheal lymph node 13 mm. Multiple anterior mediastinal lymph nodes measuring up to 15 mm. These were present previously and show mild progression. Left hilar lymph nodes also show mild progression in size. Subcarinal lymph node measures 2.7 cm with progression from the prior study. Mild right hilar  lymph lymphadenopathy with progression. 6 x 11 mm right posterior upper lobe nodule axial image 71. This shows mild progression since the prior study when it measured approximately 4 x 7 mm. 5 mm nodular thickening along the major fissure on the right axial image 85 likely a lymph node. Multiple small gallstones.  4 cm right renal cyst. Review of the MIP images confirms the above findings. IMPRESSION: Negative for pulmonary embolism Pulmonary artery enlargement with pulmonary artery hypertension right heart strain. Severe emphysema Mediastinal and bi hilar adenopathy has progressed since 2012. These may be reactive lymph nodes. Neoplasm and infection are also considerations. Consider one of the following in 3 months for both low-risk and high-risk individuals: (a) repeat chest CT, (b) follow-up PET-CT, or (c) tissue sampling. This recommendation follows the consensus statement: Guidelines for Management of Incidental Pulmonary Nodules Detected on CT Images:From the Fleischner Society 2017; published online before print (10.1148/radiol.1610960454). Posterior Right upper lobe nodule has progressed since the prior study measuring approximately 6 x 11 mm. Electronically Signed   By: Marlan Palau M.D.   On: 10/28/2015 19:14   Dg Chest Port 1 View  Result Date: 10/28/2015 CLINICAL DATA:  Chest pain short of breath. EXAM: PORTABLE CHEST 1 VIEW COMPARISON:  10/10/2015 FINDINGS: Normal cardiac silhouette. Lungs are mildly hyperinflated. There is mild chronic bronchitic markings at the bases. Emphysematous change in the upper lobes. Atherosclerotic calcification of the aorta. IMPRESSION: Upper lobe emphysema.  No acute findings. Atherosclerotic calcification of the aorta. Electronically Signed   By: Genevive Bi M.D.   On: 10/28/2015 17:08      Medications:     Scheduled Medications: . amLODipine  5 mg Oral Daily  . aspirin  325 mg Oral Daily  . heparin subcutaneous  5,000 Units Subcutaneous Q8H  .  ipratropium-albuterol  3 mL Nebulization TID  . irbesartan  75 mg Oral Daily  . methylPREDNISolone (SOLU-MEDROL) injection  40 mg Intravenous Q12H  . mometasone-formoterol  2 puff Inhalation BID  . nebivolol  10 mg Oral Daily  . potassium chloride  40 mEq Oral BID  . sodium chloride flush  3 mL Intravenous Q12H     Infusions:     PRN Medications:  albuterol, bisacodyl, HYDROcodone-acetaminophen, morphine, ondansetron **OR** ondansetron (ZOFRAN) IV, sodium chloride   Assessment:   1. Acute/Chronic Hypoxic Respiratory Failure 2. End Stage Empysema 3. 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 PVCs 5. Acute/Chronic Diastolic Heart Failure 6.  CAD- stent 2009 7. Hyperlipidemia 8. Aortic Stenosis- Mild on ECHO 9. Ventricular bigeminy  10. DNR    Plan/Discussion:    She has end-stage COPD and cor pulmonale with marked functional decline over past fee months. Has been seen by Hospice and palliative care. Now enrolled in Hospice with possible residential placement.   Would continue to diurese gently as tolerated. Stop nebivolol and irbesartan.   Ventricular bigeminy on monitor. Will  supp K+. Check mag.   The HF team will sign off. Please call with questions.    Length of Stay: 2   Heidi Morales 10/30/2015, 5:40 AM  Advanced Heart Failure Team Pager [phone] (M-F; 7a - 4p)  Please contact [facility] for night-coverage after hours (4p -7a ) and weekends on [email or web address]

## 2015-10-30 NOTE — Progress Notes (Signed)
PROGRESS NOTE                                                                                                                                                                                                             Patient Demographics:    Heidi Morales, is a 82 y.o. female, DOB - [birth year], ZOX:[id number]  Admit date - 10/28/2015   Admitting Physician Leroy Sea, MD  Outpatient Primary MD for the patient is Minda Meo, MD  LOS - 2  Chief Complaint  Patient presents with  . Shortness of Breath       Brief Narrative   Heidi Morales  is a 82 y.o. female, With history of advanced COPD primarily emphysema on 3 L nasal cannula oxygen follows with Dr. Sharee Pimple, CAD Follows with Dr. Allyson Sabal, essential hypertension, dyslipidemia, arthritis, Mild aortic stenosis Was been experiencing gradually progressive shortness of breath for  The last 1 year, now short of breath to the extent that she cannot even ambulate 10 feet, denies any significant orthopnea, no leg edema or swelling, no weight gain, denies any personal or family history of blood clots,  No recent travels on long drives, denies any fever or chills or productive cough, no significant wheezing, She today went to see her cardiologist where echocardiogram was done and was found to have severe pulmonary hypertension with pulmonary artery pressure of 120 with reduced RV function, echo in 2015 showed normal RV function, she was sent to the ER.   In the ER she was found to be in acute on chronic hypoxic respiratory failure requiring nonrebreather mask versus 10 L and is on nasal cannula oxygen, I was called to admit. Her initial blood work, EKG and chest x-ray were unremarkable.    Subjective:    Heidi Morales today has, No headache, No chest pain, No abdominal pain - No Nausea, No new weakness tingling or numbness, No Cough - +++ SOB.    Assessment  & Plan :     1. Acute on  chronic hypoxic respiratory failure in a patient with COPD on 3 L nasal cannula oxygen. I do not hear any wheezing, no productive cough or fever, chest x-ray unremarkable,  Echo done and cardiology office shows severely reduced RV function with severe pulmonary hypertension with a normal RV function 2 years ago.  Patient is  severely hypoxic, likely has cor pulmonale from secondary pulmonary hypertension from underlying COPD, CT angiogram done which rules out PE,  Supportive care with BiPAP/oxygen as needed, she is DO NOT RESUSCITATE, pulmonary is following and they have consulted cardiology as well, she likely will require right heart catheterization, no further workup per pulmonary and cardiology, prognosis appears severely guarded, likely Hospice soon.  2. COPD. she uses 3 L nasal cannula oxygen at home, treatment as in #1 above, continue breathing treatments. I doubt there is any COPD exacerbation however will try Solu-Medrol for the time being as there is nothing to lose.  3. Chronic diastolic CHF. EF preserved at 55%, no rales on exam, no edema,  BNP elevated due to right-sided failure. Treatment as in #1 above. Cardiology to follow. No edema on exam. Gentle Lasix per PCCM.  4. Mild AS. Stable no acute issues.  5.History of CAD. No acute issues chest pain free,  Continue aspirin and beta blocker for secondary prevention.    Family Communication  :  Husband  Code Status :  DNR  Diet : Clears  Disposition Plan  :  Hospice  Consults  :  PCCM, Cards  Procedures  :   CTA Chest - No PE, Pulm HTN, lymphadenopathy  TTE - Normal LV systolic function; grade 1 diastolic dysfunction; D shaped septum; calcified aortic valve with fixed noncoronary cusp and mild AS; mild LAE, RAE and RVE; severely reduced RV function; mild TR with severely elevated pulmonary pressure (estimated PASP120).    DVT Prophylaxis  :    Heparin   Lab Results  Component Value Date   PLT 234 10/29/2015     Inpatient Medications  Scheduled Meds: . amLODipine  5 mg Oral Daily  . aspirin  325 mg Oral Daily  . furosemide  40 mg Oral Daily  . heparin subcutaneous  5,000 Units Subcutaneous Q8H  . ipratropium-albuterol  3 mL Nebulization TID  . methylPREDNISolone (SOLU-MEDROL) injection  40 mg Intravenous Q12H  . mometasone-formoterol  2 puff Inhalation BID  . polyethylene glycol  17 g Oral BID  . potassium chloride  40 mEq Oral Daily  . senna  1 tablet Oral QHS  . sodium chloride flush  3 mL Intravenous Q12H   Continuous Infusions:   PRN Meds:.albuterol, bisacodyl, morphine, ondansetron **OR** ondansetron (ZOFRAN) IV, sodium chloride  Antibiotics  :    Anti-infectives    None         Objective:   Vitals:   10/29/15 2127 10/29/15 2130 10/29/15 2215 10/30/15 0325  BP:      Pulse:      Resp:      Temp: 98 F (36.7 C)  98.1 F (36.7 C)   TempSrc:      SpO2: 93% 97%    Weight:    60.2 kg (132 lb 11.5 oz)  Height:        Wt Readings from Last 3 Encounters:  10/30/15 60.2 kg (132 lb 11.5 oz)  10/10/15 58.5 kg (129 lb)  10/08/15 58.9 kg (129 lb 12.8 oz)     Intake/Output Summary (Last 24 hours) at 10/30/15 0858 Last data filed at 10/29/15 1804  Gross per 24 hour  Intake             1140 ml  Output              375 ml  Net              765 ml  Physical Exam  Awake Alert, Oriented X 3, No new F.N deficits, Normal affect [place].AT,PERRAL Supple Neck,No JVD, No cervical lymphadenopathy appriciated.  Symmetrical Chest wall movement, Good air movement bilaterally, CTAB RRR,No Gallops,Rubs or new Murmurs, No Parasternal Heave +ve B.Sounds, Abd Soft, No tenderness, No organomegaly appriciated, No rebound - guarding or rigidity. No Cyanosis, Clubbing or edema, No new Rash or bruise       Data Review:    CBC  Recent Labs Lab 10/28/15 1630 10/29/15 0240  WBC 12.7* 10.1  HGB 16.2* 15.7*  HCT 46.6* 45.2  PLT 243 234  MCV 97.5 98.5  MCH 33.9 34.2*  MCHC  34.8 34.7  RDW 13.2 13.1  LYMPHSABS 2.2  --   MONOABS 0.8  --   EOSABS 0.2  --   BASOSABS 0.1  --     Chemistries   Recent Labs Lab 10/28/15 1630 10/29/15 0240 10/30/15 0605  NA 135 137 136  K 3.6 3.6 4.4  CL 101 98* 105  CO2 23 21* 21*  GLUCOSE 107* 155* 130*  BUN 20 18 22*  CREATININE 0.93 1.02* 0.97  CALCIUM 10.6* 10.0 10.2  MG 1.7  --  2.5*   ------------------------------------------------------------------------------------------------------------------ No results for input(s): CHOL, HDL, LDLCALC, TRIG, CHOLHDL, LDLDIRECT in the last 72 hours.  No results found for: HGBA1C ------------------------------------------------------------------------------------------------------------------  Recent Labs  10/28/15 1738  TSH 1.475   ------------------------------------------------------------------------------------------------------------------ No results for input(s): VITAMINB12, FOLATE, FERRITIN, TIBC, IRON, RETICCTPCT in the last 72 hours.  Coagulation profile  Recent Labs Lab 10/28/15 1738  INR 1.02     Recent Labs  10/28/15 1738  DDIMER 0.61*    Cardiac Enzymes No results for input(s): CKMB, TROPONINI, MYOGLOBIN in the last 168 hours.  Invalid input(s): CK ------------------------------------------------------------------------------------------------------------------    Component Value Date/Time   BNP 1,267.9 (H) 10/28/2015 1623   BNP 1,305.8 (H) 10/08/2015 1354    Micro Results Recent Results (from the past 240 hour(s))  Culture, group A strep     Status: None (Preliminary result)   Collection Time: 10/28/15  8:27 PM  Result Value Ref Range Status   Specimen Description THROAT  Final   Special Requests NONE  Final   Culture TOO YOUNG TO READ  Final   Report Status PENDING  Incomplete    Radiology Reports Dg Chest 2 View  Result Date: 10/10/2015 CLINICAL DATA:  COPD EXAM: CHEST  2 VIEW COMPARISON:  January 13, 2015 FINDINGS: The  heart size remains normal. The hila and mediastinum are unchanged. Atherosclerosis seen in the thoracic aorta. No pulmonary nodules or masses. Mild coarsening of lung markings in the lung bases is stable. No other acute abnormalities. IMPRESSION: No acute abnormalities. Coarsening of lung markings in the bases may be due to scarring or chronic bronchitic change given history. There have been no acute interval changes. Electronically Signed   By: Gerome Sam III M.D   On: 10/10/2015 14:06   Ct Angio Chest Pe W And/or Wo Contrast  Result Date: 10/28/2015 CLINICAL DATA:  Progressive shortness of breath. Right heart failure. Hypertension. COPD EXAM: CT ANGIOGRAPHY CHEST WITH CONTRAST TECHNIQUE: Multidetector CT imaging of the chest was performed using the standard protocol during bolus administration of intravenous contrast. Multiplanar CT image reconstructions and MIPs were obtained to evaluate the vascular anatomy. CONTRAST:  100 mL Isovue 370 IV COMPARISON:  Chest x-ray 10/28/2015.  Chest CT 06/10/2010 FINDINGS: Negative for pulmonary embolism. Pulmonary artery enlargement compatible with pulmonary artery hypertension. Main pulmonary artery 33 mm in  diameter. Right pulmonary artery 31 mm in diameter. Left pulmonary artery 33 mm in diameter. Pulmonary artery enlargement has progressed since 2012. Extensive coronary calcification. Right heart strain. RV/ LV ratio 2.5. Reflux of contrast into the IVC and hepatic veins due to elevated right heart pressure. No pericardial effusion. COPD and emphysema which is advanced. Mild atelectasis in the right lower lobe. Negative for pneumonia. Right lower peritracheal lymph node 13 mm. Multiple anterior mediastinal lymph nodes measuring up to 15 mm. These were present previously and show mild progression. Left hilar lymph nodes also show mild progression in size. Subcarinal lymph node measures 2.7 cm with progression from the prior study. Mild right hilar lymph  lymphadenopathy with progression. 6 x 11 mm right posterior upper lobe nodule axial image 71. This shows mild progression since the prior study when it measured approximately 4 x 7 mm. 5 mm nodular thickening along the major fissure on the right axial image 85 likely a lymph node. Multiple small gallstones.  4 cm right renal cyst. Review of the MIP images confirms the above findings. IMPRESSION: Negative for pulmonary embolism Pulmonary artery enlargement with pulmonary artery hypertension right heart strain. Severe emphysema Mediastinal and bi hilar adenopathy has progressed since 2012. These may be reactive lymph nodes. Neoplasm and infection are also considerations. Consider one of the following in 3 months for both low-risk and high-risk individuals: (a) repeat chest CT, (b) follow-up PET-CT, or (c) tissue sampling. This recommendation follows the consensus statement: Guidelines for Management of Incidental Pulmonary Nodules Detected on CT Images:From the Fleischner Society 2017; published online before print (10.1148/[id number]). Posterior Right upper lobe nodule has progressed since the prior study measuring approximately 6 x 11 mm. Electronically Signed   By: Marlan Palauharles  Clark M.D.   On: 10/28/2015 19:14   Dg Chest Port 1 View  Result Date: 10/28/2015 CLINICAL DATA:  Chest pain short of breath. EXAM: PORTABLE CHEST 1 VIEW COMPARISON:  10/10/2015 FINDINGS: Normal cardiac silhouette. Lungs are mildly hyperinflated. There is mild chronic bronchitic markings at the bases. Emphysematous change in the upper lobes. Atherosclerotic calcification of the aorta. IMPRESSION: Upper lobe emphysema.  No acute findings. Atherosclerotic calcification of the aorta. Electronically Signed   By: Genevive BiStewart  Edmunds M.D.   On: 10/28/2015 17:08    Time Spent in minutes  30   Smita Lesh K M.D on 10/30/2015 at 8:58 AM  Between 7am to 7pm - Pager - [phone]  After 7pm go to [email or web address] - password [email or web address]  [facility] -  Office  [phone]

## 2015-10-31 ENCOUNTER — Telehealth: Payer: Self-pay | Admitting: *Deleted

## 2015-10-31 ENCOUNTER — Ambulatory Visit: Payer: BLUE CROSS/BLUE SHIELD | Admitting: Internal Medicine

## 2015-10-31 DIAGNOSIS — I272 Pulmonary hypertension, unspecified: Secondary | ICD-10-CM

## 2015-10-31 LAB — CULTURE, GROUP A STREP (THRC)

## 2015-10-31 NOTE — Telephone Encounter (Signed)
-----   Message from Runell GessJonathan J Berry, MD sent at 10/28/2015  4:58 PM EDT ----- Normal LV systolic function with grade 1 diastolic dysfunction and severe RV dysfunction with severe pulmonary hypertension. Patient needs to be referred to Dr. Gala RomneyBensimhon for treatment of pulmonary hypertension.

## 2015-10-31 NOTE — Telephone Encounter (Signed)
Notes Recorded by Stann MainlandJennifer O Clark, RN on 10/31/2015 at 1:28 PM EDT No answer. Left message to call back. Patient still in hospital. Left detailed message, ok per DPR with results and that Dr Allyson SabalBerry wanted to refer her to Dr Cammy BrochureBensimhonand to call back to discuss if needed. Referral entered into EPIC. ------

## 2015-11-11 ENCOUNTER — Ambulatory Visit: Payer: BLUE CROSS/BLUE SHIELD | Admitting: Cardiovascular Disease

## 2015-11-23 DEATH — deceased

## 2015-12-19 ENCOUNTER — Ambulatory Visit: Payer: Medicare Other | Admitting: Podiatry

## 2016-01-02 ENCOUNTER — Ambulatory Visit: Payer: BLUE CROSS/BLUE SHIELD | Admitting: Cardiovascular Disease
